# Patient Record
Sex: Female | Born: 1995 | Race: Black or African American | Hispanic: No | Marital: Single | Smoking: Former smoker
Health system: Southern US, Community
[De-identification: ages and names within clinical notes are randomized; demographics above are authoritative.]

## PROBLEM LIST (undated history)

## (undated) ENCOUNTER — Inpatient Hospital Stay (HOSPITAL_COMMUNITY): Payer: Self-pay

## (undated) DIAGNOSIS — Z9109 Other allergy status, other than to drugs and biological substances: Secondary | ICD-10-CM

## (undated) DIAGNOSIS — F419 Anxiety disorder, unspecified: Secondary | ICD-10-CM

## (undated) DIAGNOSIS — I10 Essential (primary) hypertension: Secondary | ICD-10-CM

## (undated) HISTORY — PX: NO PAST SURGERIES: SHX2092

---
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
## 2014-12-15 ENCOUNTER — Encounter (HOSPITAL_COMMUNITY): Payer: Self-pay | Admitting: Emergency Medicine

## 2014-12-15 ENCOUNTER — Emergency Department (HOSPITAL_COMMUNITY)
Admission: EM | Admit: 2014-12-15 | Discharge: 2014-12-15 | Disposition: A | Discharge status: Home / Self Care | Payer: Managed Care, Other (non HMO) | Source: Home / Self Care | Attending: Family Medicine | Admitting: Family Medicine

## 2014-12-15 DIAGNOSIS — A084 Viral intestinal infection, unspecified: Secondary | ICD-10-CM

## 2014-12-15 DIAGNOSIS — R1030 Lower abdominal pain, unspecified: Secondary | ICD-10-CM

## 2014-12-15 LAB — POCT URINALYSIS DIP (DEVICE)
BILIRUBIN URINE: NEGATIVE
GLUCOSE, UA: NEGATIVE mg/dL
HGB URINE DIPSTICK: NEGATIVE
KETONES UR: NEGATIVE mg/dL
Leukocytes, UA: NEGATIVE
Nitrite: NEGATIVE
Protein, ur: NEGATIVE mg/dL
Urobilinogen, UA: 0.2 mg/dL (ref 0.0–1.0)
pH: 6 (ref 5.0–8.0)

## 2014-12-15 MED ORDER — DICLOFENAC SODIUM 75 MG PO TBEC: 75.0000 mg | DELAYED_RELEASE_TABLET | Freq: Two times a day (BID) | ORAL | Status: DC

## 2014-12-15 NOTE — ED Provider Notes (Signed)
CSN: [id number]     Arrival date & time 12/15/14  4098 History   First MD Initiated Contact with Patient 12/15/14 [phone]     Chief Complaint  Patient presents with  . Abdominal Pain   (Consider location/radiation/quality/duration/timing/severity/associated sxs/prior Treatment) HPI  "Pain in ovaries". Started 3 mo ago. Getting worse. Comes and goes. Sharp pain. Tylenol and motrin w/o benefit. Not sexually active. On birth control. Denies frequency or dysuria.   Diarreha x 4 days. Loose. Some blood on toilet paper when wiped x1. Stooling 5-8 x daily. Worse w/ eating. Denies nausea, vomiting, CP, SOB, fever, rash.    Past Medical History  Diagnosis Date  . Environmental allergies   . Environmental allergies    History reviewed. No pertinent past surgical history. Family History  Problem Relation Age of Onset  . Cancer Neg Hx   . Diabetes Neg Hx   . Heart failure Neg Hx   . Hyperlipidemia Neg Hx   . Hypertension Neg Hx    History  Substance Use Topics  . Smoking status: Never Smoker   . Smokeless tobacco: Not on file  . Alcohol Use: No   OB History    No data available     Review of Systems Per HPI with all other pertinent systems negative.   Allergies  Review of patient's allergies indicates no known allergies.  Home Medications   Prior to Admission medications   Medication Sig Start Date End Date Taking? Authorizing Provider  Levonorgest-Eth Estrad 91-Day (SEASONIQUE PO) Take by mouth.   Yes Historical Provider, MD  diclofenac (VOLTAREN) 75 MG EC tablet Take 1 tablet (75 mg total) by mouth 2 (two) times daily. 12/15/14   Ozella Rocks, MD   BP 122/93 mmHg  Pulse 84  Temp(Src) 98 F (36.7 C) (Oral)  Resp 18  SpO2 100% Physical Exam  Constitutional: She is oriented to person, place, and time. She appears well-developed and well-nourished.  HENT:  Head: Normocephalic and atraumatic.  Eyes: EOM are normal. Pupils are equal, round, and reactive to light.  Neck:  Normal range of motion.  Cardiovascular: Normal rate, normal heart sounds and intact distal pulses.   Pulmonary/Chest: Effort normal and breath sounds normal.  Abdominal: Soft. Bowel sounds are normal. She exhibits no distension and no mass. There is no tenderness. There is no rebound and no guarding.  obese  Musculoskeletal: Normal range of motion.  Neurological: She is alert and oriented to person, place, and time.  Skin: Skin is warm.  Psychiatric: Her behavior is normal. Judgment and thought content normal.    ED Course  Procedures (including critical care time) Labs Review Labs Reviewed  POCT URINALYSIS DIP (DEVICE)    Imaging Review No results found.   MDM   1. Viral gastroenteritis   2. Lower abdominal pain    Etiology of lower abdominal pain is not immediately clear but may be due to ovarian cysts or other GYN etiology. Recommended patient follow-up with PCP. Patient are he has established patient with PCP in 10 days. Patient may need ultrasound or other studies. No acute issue requiring treatment at this time. Patient is actually active. Pregnancy test negative. UA normal. Recent pelvic exam without evidence of STDs. Patient denies any sexual activity since that time.  Diarrhea likely secondary to mild viral gastroenteritis. Patient may use Imodium sparingly. Patient to start probiotic and/or yogurt for discomfort. Patient with return precautions given.  Shelly Flatten, MD Family Medicine 12/15/2014, 9:49 AM  Ozella Rocksavid J Merrell, MD 12/15/14 [phone]

## 2014-12-15 NOTE — ED Notes (Signed)
Onset 3 months ago of pain.  Patient has been evaluated before for the same.  Patient has not been given a definite diagnosis.  Patient does not have an ob/gyn.  Low abdominal pain, intermittent.  Patient reports having this pain every day.  Reports pain as sharp.  Reports nothing changes this pain for better or worse.  Reports no appetite, "forcing self to eat" for 4 days.  Feels nauseated with eating.  No vomiting, reports diarrhea for 4 days.  Reports one diarrhea stool today.  No vaginal discharge

## 2014-12-15 NOTE — Discharge Instructions (Signed)
Your abdominal discomfort may be from ovarian cysts or from a mild gut infection. Please follow up with her primary care physician or OB/GYN survey can perform further studies. Please use Imodium sparingly for the diarrhea. This should resolve within a few days. He can use the Voltaren for any significant amounts of pain.

## 2014-12-29 ENCOUNTER — Emergency Department (HOSPITAL_COMMUNITY)
Admission: EM | Admit: 2014-12-29 | Discharge: 2014-12-30 | Disposition: A | Discharge status: Home / Self Care | Payer: Managed Care, Other (non HMO) | Attending: Emergency Medicine | Admitting: Emergency Medicine

## 2014-12-29 ENCOUNTER — Encounter (HOSPITAL_COMMUNITY): Payer: Self-pay | Admitting: Emergency Medicine

## 2014-12-29 ENCOUNTER — Other Ambulatory Visit: Payer: Self-pay | Admitting: Family Medicine

## 2014-12-29 DIAGNOSIS — R102 Pelvic and perineal pain: Secondary | ICD-10-CM

## 2014-12-29 DIAGNOSIS — Z791 Long term (current) use of non-steroidal anti-inflammatories (NSAID): Secondary | ICD-10-CM | POA: Insufficient documentation

## 2014-12-29 DIAGNOSIS — E669 Obesity, unspecified: Secondary | ICD-10-CM | POA: Diagnosis not present

## 2014-12-29 DIAGNOSIS — Z79899 Other long term (current) drug therapy: Secondary | ICD-10-CM | POA: Insufficient documentation

## 2014-12-29 NOTE — ED Notes (Signed)
Pt arrived to the ED with a complaint of pelvic pain.  Pt states the pain is midline lower abdomen/upper pelvic region.  Pt states she has seen at Urgent Care and her Doctors who suspect an ovarian cyst.  Pt has an ultrasound scheduled for Tuesday but the pain is unbearable so she was advised to come here.

## 2014-12-30 ENCOUNTER — Emergency Department (HOSPITAL_COMMUNITY): Payer: Managed Care, Other (non HMO)

## 2014-12-30 LAB — CBC WITH DIFFERENTIAL/PLATELET
BASOS ABS: 0 10*3/uL (ref 0.0–0.1)
BASOS PCT: 0 % (ref 0–1)
EOS ABS: 0 10*3/uL (ref 0.0–0.7)
EOS PCT: 1 % (ref 0–5)
HCT: 41.4 % (ref 36.0–46.0)
HEMOGLOBIN: 13.7 g/dL (ref 12.0–15.0)
Lymphocytes Relative: 43 % (ref 12–46)
Lymphs Abs: 2.3 10*3/uL (ref 0.7–4.0)
MCH: 31.3 pg (ref 26.0–34.0)
MCHC: 33.1 g/dL (ref 30.0–36.0)
MCV: 94.5 fL (ref 78.0–100.0)
MONO ABS: 0.5 10*3/uL (ref 0.1–1.0)
Monocytes Relative: 9 % (ref 3–12)
Neutro Abs: 2.6 10*3/uL (ref 1.7–7.7)
Neutrophils Relative %: 47 % (ref 43–77)
Platelets: 207 10*3/uL (ref 150–400)
RBC: 4.38 MIL/uL (ref 3.87–5.11)
RDW: 13.8 % (ref 11.5–15.5)
WBC: 5.4 10*3/uL (ref 4.0–10.5)

## 2014-12-30 LAB — URINALYSIS, ROUTINE W REFLEX MICROSCOPIC
Bilirubin Urine: NEGATIVE
Glucose, UA: NEGATIVE mg/dL
Hgb urine dipstick: NEGATIVE
KETONES UR: NEGATIVE mg/dL
LEUKOCYTES UA: NEGATIVE
NITRITE: NEGATIVE
PH: 6 (ref 5.0–8.0)
PROTEIN: NEGATIVE mg/dL
Specific Gravity, Urine: 1.027 (ref 1.005–1.030)
Urobilinogen, UA: 1 mg/dL (ref 0.0–1.0)

## 2014-12-30 LAB — BASIC METABOLIC PANEL
ANION GAP: 5 (ref 5–15)
BUN: 13 mg/dL (ref 6–23)
CALCIUM: 8.6 mg/dL (ref 8.4–10.5)
CHLORIDE: 108 mmol/L (ref 96–112)
CO2: 23 mmol/L (ref 19–32)
Creatinine, Ser: 0.8 mg/dL (ref 0.50–1.10)
GFR calc Af Amer: >90 mL/min (ref >90)
GLUCOSE: 85 mg/dL (ref 70–99)
Potassium: 4.1 mmol/L (ref 3.5–5.1)
SODIUM: 136 mmol/L (ref 135–145)

## 2014-12-30 LAB — I-STAT BETA HCG BLOOD, ED (MC, WL, AP ONLY): I-stat hCG, quantitative: <5 m[IU]/mL (ref <5)

## 2014-12-30 NOTE — ED Notes (Signed)
US bedside

## 2014-12-30 NOTE — Discharge Instructions (Signed)
Abdominal Pain, Women °Abdominal (stomach, pelvic, or belly) pain can be caused by many things. It is important to tell your doctor: °· The location of the pain. °· Does it come and go or is it present all the time? °· Are there things that start the pain (eating certain foods, exercise)? °· Are there other symptoms associated with the pain (fever, nausea, vomiting, diarrhea)? °All of this is helpful to know when trying to find the cause of the pain. °CAUSES  °· Stomach: virus or bacteria infection, or ulcer. °· Intestine: appendicitis (inflamed appendix), regional ileitis (Crohn's disease), ulcerative colitis (inflamed colon), irritable bowel syndrome, diverticulitis (inflamed diverticulum of the colon), or cancer of the stomach or intestine. °· Gallbladder disease or stones in the gallbladder. °· Kidney disease, kidney stones, or infection. °· Pancreas infection or cancer. °· Fibromyalgia (pain disorder). °· Diseases of the female organs: °¨ Uterus: fibroid (non-cancerous) tumors or infection. °¨ Fallopian tubes: infection or tubal pregnancy. °¨ Ovary: cysts or tumors. °¨ Pelvic adhesions (scar tissue). °¨ Endometriosis (uterus lining tissue growing in the pelvis and on the pelvic organs). °¨ Pelvic congestion syndrome (female organs filling up with blood just before the menstrual period). °¨ Pain with the menstrual period. °¨ Pain with ovulation (producing an egg). °¨ Pain with an IUD (intrauterine device, birth control) in the uterus. °¨ Cancer of the female organs. °· Functional pain (pain not caused by a disease, may improve without treatment). °· Psychological pain. °· Depression. °DIAGNOSIS  °Your doctor will decide the seriousness of your pain by doing an examination. °· Blood tests. °· X-rays. °· Ultrasound. °· CT scan (computed tomography, special type of X-ray). °· MRI (magnetic resonance imaging). °· Cultures, for infection. °· Barium enema (dye inserted in the large intestine, to better view it with  X-rays). °· Colonoscopy (looking in intestine with a lighted tube). °· Laparoscopy (minor surgery, looking in abdomen with a lighted tube). °· Major abdominal exploratory surgery (looking in abdomen with a large incision). °TREATMENT  °The treatment will depend on the cause of the pain.  °· Many cases can be observed and treated at home. °· Over-the-counter medicines recommended by your caregiver. °· Prescription medicine. °· Antibiotics, for infection. °· Birth control pills, for painful periods or for ovulation pain. °· Hormone treatment, for endometriosis. °· Nerve blocking injections. °· Physical therapy. °· Antidepressants. °· Counseling with a psychologist or psychiatrist. °· Minor or major surgery. °HOME CARE INSTRUCTIONS  °· Do not take laxatives, unless directed by your caregiver. °· Take over-the-counter pain medicine only if ordered by your caregiver. Do not take aspirin because it can cause an upset stomach or bleeding. °· Try a clear liquid diet (broth or water) as ordered by your caregiver. Slowly move to a bland diet, as tolerated, if the pain is related to the stomach or intestine. °· Have a thermometer and take your temperature several times a day, and record it. °· Bed rest and sleep, if it helps the pain. °· Avoid sexual intercourse, if it causes pain. °· Avoid stressful situations. °· Keep your follow-up appointments and tests, as your caregiver orders. °· If the pain does not go away with medicine or surgery, you may try: °¨ Acupuncture. °¨ Relaxation exercises (yoga, meditation). °¨ Group therapy. °¨ Counseling. °SEEK MEDICAL CARE IF:  °· You notice certain foods cause stomach pain. °· Your home care treatment is not helping your pain. °· You need stronger pain medicine. °· You want your IUD removed. °· You feel faint or   lightheaded. °· You develop nausea and vomiting. °· You develop a rash. °· You are having side effects or an allergy to your medicine. °SEEK IMMEDIATE MEDICAL CARE IF:  °· Your  pain does not go away or gets worse. °· You have a fever. °· Your pain is felt only in portions of the abdomen. The right side could possibly be appendicitis. The left lower portion of the abdomen could be colitis or diverticulitis. °· You are passing blood in your stools (bright red or black tarry stools, with or without vomiting). °· You have blood in your urine. °· You develop chills, with or without a fever. °· You pass out. °MAKE SURE YOU:  °· Understand these instructions. °· Will watch your condition. °· Will get help right away if you are not doing well or get worse. °Document Released: 07/28/2007 Document Revised: 02/14/2014 Document Reviewed: 08/17/2009 °[employer]® Patient Information ©2015 [employer]. This information is not intended to replace advice given to you by your health care provider. Make sure you discuss any questions you have with your health care provider. ° °

## 2014-12-30 NOTE — ED Provider Notes (Signed)
CSN: [id number]     Arrival date & time 12/29/14  2203 History   First MD Initiated Contact with Patient 12/30/14 0022     Chief Complaint  Patient presents with  . Pelvic Pain      HPI Patient reports she's had lower abdominal and pelvic pain over the past 3 months.  She has never seen a gynecologist for this.  She recently went and saw physician is ordered an outpatient ultrasound that was concerned about the possibility of a cyst.  No imaging has been done to this point.  She does report some diarrhea over the past several weeks but no diarrhea recently.  No dysuria or vaginal complaints.  No vaginal discharge or pain with intercourse.  She denies significant abnormal vaginal bleeding.  Symptoms are mild to moderate in severity.  Nothing worsens or improves her symptoms.  She tried over-the-counter pain medication without improvement.   Past Medical History  Diagnosis Date  . Environmental allergies   . Environmental allergies    History reviewed. No pertinent past surgical history. Family History  Problem Relation Age of Onset  . Cancer Neg Hx   . Diabetes Neg Hx   . Heart failure Neg Hx   . Hyperlipidemia Neg Hx   . Hypertension Neg Hx    History  Substance Use Topics  . Smoking status: Never Smoker   . Smokeless tobacco: Not on file  . Alcohol Use: No   OB History    No data available     Review of Systems  All other systems reviewed and are negative.     Allergies  Tomato and Other  Home Medications   Prior to Admission medications   Medication Sig Start Date End Date Taking? Authorizing Provider  acetaminophen (TYLENOL) 500 MG tablet Take 1,000 mg by mouth every 6 (six) hours as needed for moderate pain.   Yes Historical Provider, MD  diclofenac (VOLTAREN) 75 MG EC tablet Take 1 tablet (75 mg total) by mouth 2 (two) times daily. 12/15/14  Yes Ozella Rocks, MD  Levonorgest-Eth Charlott Holler 91-Day (SEASONIQUE PO) Take 1 tablet by mouth daily.    Yes Historical  Provider, MD   BP 134/76 mmHg  Pulse 90  Temp(Src) 98.2 F (36.8 C) (Oral)  Resp 20  SpO2 100% Physical Exam  Constitutional: She is oriented to person, place, and time. She appears well-developed and well-nourished. No distress.  HENT:  Head: Normocephalic and atraumatic.  Eyes: EOM are normal.  Neck: Normal range of motion.  Cardiovascular: Normal rate, regular rhythm and normal heart sounds.   Pulmonary/Chest: Effort normal and breath sounds normal.  Abdominal: Soft. She exhibits no distension.  Obese.  Mild lower abdominal/upper pelvic tenderness without guarding or rebound  Musculoskeletal: Normal range of motion.  Neurological: She is alert and oriented to person, place, and time.  Skin: Skin is warm and dry.  Psychiatric: She has a normal mood and affect. Judgment normal.  Nursing note and vitals reviewed.   ED Course  Procedures (including critical care time) Labs Review Labs Reviewed  URINALYSIS, ROUTINE W REFLEX MICROSCOPIC  CBC WITH DIFFERENTIAL/PLATELET  BASIC METABOLIC PANEL  I-STAT BETA HCG BLOOD, ED ([facility], [facility], [facility] ONLY)    Imaging Review US Transvaginal Non-ob  12/30/2014   CLINICAL DATA:  Acute onset of pelvic pain.  Initial encounter.  EXAM: TRANSABDOMINAL AND TRANSVAGINAL ULTRASOUND OF PELVIS  DOPPLER ULTRASOUND OF OVARIES  TECHNIQUE: Both transabdominal and transvaginal ultrasound examinations of the pelvis were performed. Transabdominal  technique was performed for global imaging of the pelvis including uterus, ovaries, adnexal regions, and pelvic cul-de-sac.  It was necessary to proceed with endovaginal exam following the transabdominal exam to visualize the uterus in greater detail. Color and duplex Doppler ultrasound was utilized to evaluate blood flow to the ovaries.  COMPARISON:  None.  FINDINGS: Uterus  Measurements: 7.1 x 4.1 x 4.7 cm. No fibroids or other mass visualized.  Endometrium  Thickness: 0.6 cm.  No focal abnormality visualized.  Right ovary   Measurements: 2.9 x 2.8 x 2.2 cm. Normal appearance/no adnexal mass.  Left ovary  Measurements: 3.2 x 2.5 x 1.7 cm. Normal appearance/no adnexal mass.  Pulsed Doppler evaluation of both ovaries demonstrates normal low-resistance arterial and venous waveforms.  Other findings  No free fluid is seen within the pelvic cul-de-sac.  IMPRESSION: Unremarkable pelvic ultrasound.  No evidence of ovarian torsion.   Electronically Signed   By: Roanna RaiderJeffery  Chang M.D.   On: 12/30/2014 03:06   [place] Pelvis Complete  12/30/2014   CLINICAL DATA:  Acute onset of pelvic pain.  Initial encounter.  EXAM: TRANSABDOMINAL AND TRANSVAGINAL ULTRASOUND OF PELVIS  DOPPLER ULTRASOUND OF OVARIES  TECHNIQUE: Both transabdominal and transvaginal ultrasound examinations of the pelvis were performed. Transabdominal technique was performed for global imaging of the pelvis including uterus, ovaries, adnexal regions, and pelvic cul-de-sac.  It was necessary to proceed with endovaginal exam following the transabdominal exam to visualize the uterus in greater detail. Color and duplex Doppler ultrasound was utilized to evaluate blood flow to the ovaries.  COMPARISON:  None.  FINDINGS: Uterus  Measurements: 7.1 x 4.1 x 4.7 cm. No fibroids or other mass visualized.  Endometrium  Thickness: 0.6 cm.  No focal abnormality visualized.  Right ovary  Measurements: 2.9 x 2.8 x 2.2 cm. Normal appearance/no adnexal mass.  Left ovary  Measurements: 3.2 x 2.5 x 1.7 cm. Normal appearance/no adnexal mass.  Pulsed Doppler evaluation of both ovaries demonstrates normal low-resistance arterial and venous waveforms.  Other findings  No free fluid is seen within the pelvic cul-de-sac.  IMPRESSION: Unremarkable pelvic ultrasound.  No evidence of ovarian torsion.   Electronically Signed   By: Roanna RaiderJeffery  Chang M.D.   On: 12/30/2014 03:06   [place] Art/ven Flow Abd Pelv Doppler  12/30/2014   CLINICAL DATA:  Acute onset of pelvic pain.  Initial encounter.  EXAM: TRANSABDOMINAL AND  TRANSVAGINAL ULTRASOUND OF PELVIS  DOPPLER ULTRASOUND OF OVARIES  TECHNIQUE: Both transabdominal and transvaginal ultrasound examinations of the pelvis were performed. Transabdominal technique was performed for global imaging of the pelvis including uterus, ovaries, adnexal regions, and pelvic cul-de-sac.  It was necessary to proceed with endovaginal exam following the transabdominal exam to visualize the uterus in greater detail. Color and duplex Doppler ultrasound was utilized to evaluate blood flow to the ovaries.  COMPARISON:  None.  FINDINGS: Uterus  Measurements: 7.1 x 4.1 x 4.7 cm. No fibroids or other mass visualized.  Endometrium  Thickness: 0.6 cm.  No focal abnormality visualized.  Right ovary  Measurements: 2.9 x 2.8 x 2.2 cm. Normal appearance/no adnexal mass.  Left ovary  Measurements: 3.2 x 2.5 x 1.7 cm. Normal appearance/no adnexal mass.  Pulsed Doppler evaluation of both ovaries demonstrates normal low-resistance arterial and venous waveforms.  Other findings  No free fluid is seen within the pelvic cul-de-sac.  IMPRESSION: Unremarkable pelvic ultrasound.  No evidence of ovarian torsion.   Electronically Signed   By: Roanna RaiderJeffery  Chang M.D.   On: 12/30/2014 03:06  EKG Interpretation None      MDM   Final diagnoses:  Pelvic pain in female    Labs and ultrasound without significant abnormality.  Patient is not pregnant.  I referred her to the women's outpatient clinic for gynecology follow-up.  I personally performed the services described in this documentation, which was scribed in my presence. The recorded information has been reviewed and is accurate.    Azalia Bilis, MD 12/30/14 [phone]

## 2015-01-03 ENCOUNTER — Other Ambulatory Visit: Payer: Managed Care, Other (non HMO)

## 2015-01-07 ENCOUNTER — Emergency Department (HOSPITAL_COMMUNITY)
Admission: EM | Admit: 2015-01-07 | Discharge: 2015-01-07 | Disposition: A | Discharge status: Home / Self Care | Payer: Managed Care, Other (non HMO) | Attending: Emergency Medicine | Admitting: Emergency Medicine

## 2015-01-07 ENCOUNTER — Encounter (HOSPITAL_COMMUNITY): Payer: Self-pay

## 2015-01-07 DIAGNOSIS — G8929 Other chronic pain: Secondary | ICD-10-CM | POA: Insufficient documentation

## 2015-01-07 DIAGNOSIS — Z791 Long term (current) use of non-steroidal anti-inflammatories (NSAID): Secondary | ICD-10-CM | POA: Diagnosis not present

## 2015-01-07 DIAGNOSIS — Z793 Long term (current) use of hormonal contraceptives: Secondary | ICD-10-CM | POA: Insufficient documentation

## 2015-01-07 DIAGNOSIS — R102 Pelvic and perineal pain: Secondary | ICD-10-CM | POA: Diagnosis not present

## 2015-01-07 DIAGNOSIS — Z8709 Personal history of other diseases of the respiratory system: Secondary | ICD-10-CM | POA: Insufficient documentation

## 2015-01-07 DIAGNOSIS — Z3202 Encounter for pregnancy test, result negative: Secondary | ICD-10-CM | POA: Insufficient documentation

## 2015-01-07 LAB — URINALYSIS W MICROSCOPIC (NOT AT ARMC)
BILIRUBIN URINE: NEGATIVE
Glucose, UA: NEGATIVE mg/dL
HGB URINE DIPSTICK: NEGATIVE
Ketones, ur: NEGATIVE mg/dL
LEUKOCYTES UA: NEGATIVE
Nitrite: NEGATIVE
PH: 6.5 (ref 5.0–8.0)
PROTEIN: NEGATIVE mg/dL
SPECIFIC GRAVITY, URINE: 1.023 (ref 1.005–1.030)
Urobilinogen, UA: 0.2 mg/dL (ref 0.0–1.0)

## 2015-01-07 LAB — PREGNANCY, URINE: PREG TEST UR: NEGATIVE

## 2015-01-07 MED ORDER — NAPROXEN 500 MG PO TABS: 500.0000 mg | ORAL_TABLET | Freq: Two times a day (BID) | ORAL | Status: DC

## 2015-01-07 NOTE — ED Notes (Signed)
Pt seen here on 3/17 for abdominal/pelvic pain.  Pt has had ongoing pelvic pain for months.  No dx of 3/17 after pelvic, ultrasound, labs etc.  C/o nausea wit no vomiting.  Denies discharge, difficulty urinating, change in bowel or fever

## 2015-01-07 NOTE — Discharge Instructions (Signed)
Return to the ED with any concerns including fever/chills, vomiting and not able to keep down liquids, vaginal bleeding and soaking more than one pad per hour, decreased level of alertness/lethargy, or any other alarming symptoms  You should stop the diclofenac if it is not helping and start the naproxen as prescribed.  You can also use heating pad as needed to help with pain

## 2015-01-07 NOTE — ED Provider Notes (Signed)
CSN: [id number]     Arrival date & time 01/07/15  1041 History   First MD Initiated Contact with Patient 01/07/15 1142     Chief Complaint  Patient presents with  . Pelvic Pain     (Consider location/radiation/quality/duration/timing/severity/associated sxs/prior Treatment) HPI  Pt presents with ongoing pelvic pain.  Pt states pain has been present for the past 3-4 months. Pain is constant in nature.  No fever/chills. No dysuria,.  Denies vaginal discharge or vaginal bleeding.  She states she is on seasonelle, next menses is expected in one month.  She was seen in the ED 8 days ago and had negative pelvic ultrasound.  Has had some nausea, no vomiting.  No fever.  She has appointment scheduled with GYN for 4/1 which is within the next week.  Pt states she had pelvic exam at urgent care before coming to the ED and states she was not given any medications at that time and was told everything was normal.  She has been trying diclofenac for discomfort which she states is not working.  There are no other associated systemic symptoms, there are no other alleviating or modifying factors.   Past Medical History  Diagnosis Date  . Environmental allergies   . Environmental allergies    History reviewed. No pertinent past surgical history. Family History  Problem Relation Age of Onset  . Cancer Neg Hx   . Diabetes Neg Hx   . Heart failure Neg Hx   . Hyperlipidemia Neg Hx   . Hypertension Neg Hx    History  Substance Use Topics  . Smoking status: Never Smoker   . Smokeless tobacco: Not on file  . Alcohol Use: No   OB History    No data available     Review of Systems  ROS reviewed and all otherwise negative except for mentioned in HPI    Allergies  Tomato and Other  Home Medications   Prior to Admission medications   Medication Sig Start Date End Date Taking? Authorizing Provider  acetaminophen (TYLENOL) 500 MG tablet Take 1,000 mg by mouth every 6 (six) hours as needed for  moderate pain.   Yes Historical Provider, MD  diclofenac (VOLTAREN) 75 MG EC tablet Take 1 tablet (75 mg total) by mouth 2 (two) times daily. 12/15/14  Yes Ozella Rocks, MD  fexofenadine (ALLEGRA) 180 MG tablet Take 180 mg by mouth daily as needed for allergies or rhinitis.   Yes Historical Provider, MD  Levonorgest-Eth Estrad 91-Day (SEASONIQUE PO) Take 1 tablet by mouth daily.    Yes Historical Provider, MD  naproxen (NAPROSYN) 500 MG tablet Take 1 tablet (500 mg total) by mouth 2 (two) times daily. 01/07/15   Jerelyn Scott, MD   BP 114/69 mmHg  Pulse 86  Temp(Src) 98 F (36.7 C) (Oral)  Resp 18  SpO2 100%  Vitals reviewed Physical Exam  Physical Examination: General appearance - alert, well appearing, and in no distress Mental status - alert, oriented to person, place, and time Eyes - no conjunctival injection, no scleral icterus Chest - clear to auscultation, no wheezes, rales or rhonchi, symmetric air entry Heart - normal rate, regular rhythm, normal S1, S2, no murmurs, rubs, clicks or gallops Abdomen - soft, suprapubic tenderness to palpation, no gaurding or rebound tenderness, nondistended, no masses or organomegaly Extremities - peripheral pulses normal, no pedal edema, no clubbing or cyanosis Skin - normal coloration and turgor, no rashes  ED Course  Procedures (including critical care time) Labs  Review Labs Reviewed  URINALYSIS W MICROSCOPIC  PREGNANCY, URINE    Imaging Review No results found.   EKG Interpretation None      MDM   Final diagnoses:  Pelvic pain in female    Pt presenting with chronic pelvic pain that has been ongoing and constant for the past 4 months.  She has had normal pelvic ultrasound.  She has been taking diclofenac without much relief.  I discussed doing a pelvic exam with patient and she declined to have this done- saying it was done at urgent care due to her pain and she was told it was normal.  I have d/w her and her mother the  importance of gyn followup as they are the experts in this area and will advise further about her symptoms.  Doubt appendicitis due to chronicity of pain and midline tenderness.  Doubt other emergent intraabdominal or pelvic process. PID is still on the differential but as patient declined pelvic exam I cannot diagnos this.  Pt given rx for naproxen, encouraged her to keep appointment with GYN later this week.  Discharged with strict return precautions.  Pt agreeable with plan.    Jerelyn ScottMartha Linker, MD 01/07/15 1336

## 2015-09-04 ENCOUNTER — Emergency Department (HOSPITAL_COMMUNITY)
Admission: EM | Admit: 2015-09-04 | Discharge: 2015-09-04 | Disposition: A | Discharge status: Home / Self Care | Payer: Managed Care, Other (non HMO) | Attending: Physician Assistant | Admitting: Physician Assistant

## 2015-09-04 ENCOUNTER — Encounter (HOSPITAL_COMMUNITY): Payer: Self-pay | Admitting: Emergency Medicine

## 2015-09-04 DIAGNOSIS — Z791 Long term (current) use of non-steroidal anti-inflammatories (NSAID): Secondary | ICD-10-CM | POA: Insufficient documentation

## 2015-09-04 DIAGNOSIS — Z3202 Encounter for pregnancy test, result negative: Secondary | ICD-10-CM | POA: Diagnosis not present

## 2015-09-04 DIAGNOSIS — N898 Other specified noninflammatory disorders of vagina: Secondary | ICD-10-CM | POA: Diagnosis present

## 2015-09-04 DIAGNOSIS — Z3200 Encounter for pregnancy test, result unknown: Secondary | ICD-10-CM

## 2015-09-04 DIAGNOSIS — A64 Unspecified sexually transmitted disease: Secondary | ICD-10-CM | POA: Insufficient documentation

## 2015-09-04 LAB — URINALYSIS, ROUTINE W REFLEX MICROSCOPIC
BILIRUBIN URINE: NEGATIVE
GLUCOSE, UA: NEGATIVE mg/dL
Ketones, ur: NEGATIVE mg/dL
Nitrite: NEGATIVE
Protein, ur: NEGATIVE mg/dL
SPECIFIC GRAVITY, URINE: 1.02 (ref 1.005–1.030)
pH: 6 (ref 5.0–8.0)

## 2015-09-04 LAB — POC URINE PREG, ED: PREG TEST UR: NEGATIVE

## 2015-09-04 LAB — URINE MICROSCOPIC-ADD ON

## 2015-09-04 LAB — WET PREP, GENITAL
CLUE CELLS WET PREP: NONE SEEN
SPERM: NONE SEEN
YEAST WET PREP: NONE SEEN

## 2015-09-04 MED ORDER — AZITHROMYCIN 250 MG PO TABS
1000.0000 mg | ORAL_TABLET | Freq: Once | ORAL | Status: AC
  Administered 2015-09-04: 1000 mg via ORAL
  Filled 2015-09-04: qty 4

## 2015-09-04 MED ORDER — ONDANSETRON HCL 4 MG PO TABS
4.0000 mg | ORAL_TABLET | Freq: Once | ORAL | Status: AC
  Administered 2015-09-04: 4 mg via ORAL

## 2015-09-04 MED ORDER — CEFTRIAXONE SODIUM 250 MG IJ SOLR
250.0000 mg | Freq: Once | INTRAMUSCULAR | Status: AC
  Administered 2015-09-04: 250 mg via INTRAMUSCULAR
  Filled 2015-09-04: qty 250

## 2015-09-04 MED ORDER — STERILE WATER FOR INJECTION IJ SOLN
INTRAMUSCULAR | Status: AC
  Administered 2015-09-04: 10 mL
  Filled 2015-09-04: qty 10

## 2015-09-04 MED ORDER — METRONIDAZOLE 500 MG PO TABS
2000.0000 mg | ORAL_TABLET | Freq: Once | ORAL | Status: AC
  Administered 2015-09-04: 2000 mg via ORAL
  Filled 2015-09-04: qty 4

## 2015-09-04 NOTE — ED Notes (Addendum)
Patient here for pregnancy test. LMP 07/20/2015. Also complains of vaginal discharge.

## 2015-09-04 NOTE — ED Provider Notes (Signed)
CSN: [id number]     Arrival date & time 09/04/15  1425 History  By signing my name below, I, Placido Sou, attest that this documentation has been prepared under the direction and in the presence of Cheri Fowler, PA-C. Electronically Signed: Placido Sou, ED Scribe. 09/04/2015. 2:51 PM.   Chief Complaint  Patient presents with  . Possible Pregnancy   The history is provided by the patient. No language interpreter was used.    HPI Comments: Maria Flores is a 19 y.o. female who presents to the Emergency Department requesting a pregnancy test. Pt notes being 2 weeks late for her menstrual cycle. She notes initial, mild, clear vaginal discharge on 11/9 when her menstrual cycle should have began, mild, intermittent, abd cramping, mild breast tenderness, mild, intermittent, nausea with onset 1 week ago and mild lower back pain. She notes her LNMP was on 10/9. She notes being sexually active with 1 female partner further noting that they did not use protection during intercourse around 10/25. She denies concern for STD's, but would like to be tested. She currently takes [place].  Pt denies a hx of seizures. She denies vaginal bleeding, vomiting, HA, or urinary symptoms.   PCP: Dr. Cliffton Asters  Past Medical History  Diagnosis Date  . Environmental allergies   . Environmental allergies    History reviewed. No pertinent past surgical history. Family History  Problem Relation Age of Onset  . Cancer Neg Hx   . Diabetes Neg Hx   . Heart failure Neg Hx   . Hyperlipidemia Neg Hx   . Hypertension Neg Hx    Social History  Substance Use Topics  . Smoking status: Never Smoker   . Smokeless tobacco: None  . Alcohol Use: No   OB History    No data available     Review of Systems A complete 10 system review of systems was obtained and all systems are negative except as noted in the HPI and PMH.  Allergies  Tomato and Other  Home Medications   Prior to Admission medications   Medication  Sig Start Date End Date Taking? Authorizing Provider  acetaminophen (TYLENOL) 500 MG tablet Take 1,000 mg by mouth every 6 (six) hours as needed for moderate pain.    Historical Provider, MD  diclofenac (VOLTAREN) 75 MG EC tablet Take 1 tablet (75 mg total) by mouth 2 (two) times daily. 12/15/14   Ozella Rocks, MD  fexofenadine (ALLEGRA) 180 MG tablet Take 180 mg by mouth daily as needed for allergies or rhinitis.    Historical Provider, MD  Terri Piedra 91-Day (SEASONIQUE PO) Take 1 tablet by mouth daily.     Historical Provider, MD  naproxen (NAPROSYN) 500 MG tablet Take 1 tablet (500 mg total) by mouth 2 (two) times daily. 01/07/15   Jerelyn Scott, MD   BP 163/100 mmHg  Pulse 104  Temp(Src) 98.2 F (36.8 C) (Oral)  Resp 18  SpO2 95% Physical Exam  Constitutional: She is oriented to person, place, and time. She appears well-developed and well-nourished.  HENT:  Head: Normocephalic and atraumatic.  Mouth/Throat: Oropharynx is clear and moist. No oropharyngeal exudate.  Eyes: Conjunctivae are normal.  Neck: Normal range of motion. Neck supple. No tracheal deviation present.  Cardiovascular: Normal rate, regular rhythm and normal heart sounds.   No murmur heard. Pulmonary/Chest: Effort normal and breath sounds normal. No accessory muscle usage or stridor. No respiratory distress. She has no wheezes. She has no rhonchi. She has no rales.  Abdominal: Soft. Bowel sounds are normal. She exhibits no distension. There is no tenderness. There is no rebound and no guarding.  Genitourinary: Vagina normal and uterus normal. Uterus is not enlarged and not tender. Cervix exhibits discharge (mild). Cervix exhibits no motion tenderness. Right adnexum displays no tenderness. Left adnexum displays no tenderness. No tenderness or bleeding in the vagina. No vaginal discharge found.  Musculoskeletal: Normal range of motion.  Lymphadenopathy:    She has no cervical adenopathy.  Neurological: She is  alert and oriented to person, place, and time.  Speech clear without dysarthria.  Skin: Skin is warm and dry. She is not diaphoretic.  Psychiatric: She has a normal mood and affect. Her behavior is normal.  Nursing note and vitals reviewed.  ED Course  Procedures  DIAGNOSTIC STUDIES: Oxygen Saturation is 95% on RA, adequate by my interpretation.    COORDINATION OF CARE: 2:45 PM Pt presents today due to a possible pregnancy and requests a pregnancy test. Discussed next steps with pt including a UA and pelvic exam. Pt agreed to plan.  Labs Review Labs Reviewed  WET PREP, GENITAL - Abnormal; Notable for the following:    Trich, Wet Prep PRESENT (*)    WBC, Wet Prep HPF POC MANY (*)    All other components within normal limits  URINALYSIS, ROUTINE W REFLEX MICROSCOPIC (NOT AT [facility]) - Abnormal; Notable for the following:    APPearance CLOUDY (*)    Hgb urine dipstick TRACE (*)    Leukocytes, UA LARGE (*)    All other components within normal limits  URINE MICROSCOPIC-ADD ON - Abnormal; Notable for the following:    Squamous Epithelial / LPF 0-5 (*)    Bacteria, UA RARE (*)    All other components within normal limits  HIV ANTIBODY (ROUTINE TESTING)  RPR  POC URINE PREG, ED  GC/CHLAMYDIA PROBE AMP ([place]) NOT AT [facility]    Imaging Review No results found. I have personally reviewed and evaluated these lab results as part of my medical decision-making.   EKG Interpretation None      MDM   Final diagnoses:  Sexually transmitted disease (STD)  Encounter for pregnancy test    Patient presents wanting a pregnancy test.  LNMP 10/6.  Patient is sexually active with one partner.  She states she had clear abnormal discharge around 11/9.  No other vaginal complaints.  No urinary symptoms.  Intermittent breast tenderness, nausea, and cramping.  VS show 163/100, patient has a PCP.  She will follow up regarding elevated BP reading.  On exam, heart RRR, lungs CTAB, abdomen soft and  benign.  GU exam, discharge present, no CMT tenderness or adnexal tenderness.  Will obtain HIV, RPR, GC/chlamydia, UA, and urine pregnancy.    Urine pregnancy negative. UA shows large leukocytes.  Suspect this is secondary to cervical discharge, and not UTI. Wet prep shows trich and WBCs.  Will give flagyl, azithromycin, and rocephin here.  Doubt PID.  Doubt TOA.  Evaluation does not show pathology requring ongoing emergent intervention or admission. Pt is hemodynamically stable and mentating appropriately. Discussed findings/results and plan with patient/guardian, who agrees with plan. All questions answered. Return precautions discussed and outpatient follow up given.   I personally performed the services described in this documentation, which was scribed in my presence. The recorded information has been reviewed and is accurate.     Cheri FowlerKayla Marca Gadsby, PA-C 09/04/15 1637  Courteney Randall AnLyn Mackuen, MD 09/05/15 [phone]

## 2015-09-04 NOTE — Discharge Instructions (Signed)
Sexually Transmitted Disease °A sexually transmitted disease (STD) is a disease or infection often passed to another person during sex. However, STDs can be passed through nonsexual ways. An STD can be passed through: °· Spit (saliva). °· Semen. °· Blood. °· Mucus from the vagina. °· Pee (urine). °HOW CAN I LESSEN MY CHANCES OF GETTING AN STD? °· Use: °· Latex condoms. °· Water-soluble lubricants with condoms. Do not use petroleum jelly or oils. °· Dental dams. These are small pieces of latex that are used as a barrier during oral sex. °· Avoid having more than one sex partner. °· Do not have sex with someone who has other sex partners. °· Do not have sex with anyone you do not know or who is at high risk for an STD. °· Avoid risky sex that can break your skin. °· Do not have sex if you have open sores on your mouth or skin. °· Avoid drinking too much alcohol or taking illegal drugs. Alcohol and drugs can affect your good judgment. °· Avoid oral and anal sex acts. °· Get shots (vaccines) for HPV and hepatitis. °· If you are at risk of being infected with HIV, it is advised that you take a certain medicine daily to prevent HIV infection. This is called pre-exposure prophylaxis (PrEP). You may be at risk if: °· You are a man who has sex with other men (MSM). °· You are attracted to the opposite sex (heterosexual) and are having sex with more than one partner. °· You take drugs with a needle. °· You have sex with someone who has HIV. °· Talk with your doctor about if you are at high risk of being infected with HIV. If you begin to take PrEP, get tested for HIV first. Get tested every 3 months for as long as you are taking PrEP. °· Get tested for STDs every year if you are sexually active. If you are treated for an STD, get tested again 3 months after you are treated. °WHAT SHOULD I DO IF I THINK I HAVE AN STD? °· See your doctor. °· Tell your sex partner(s) that you have an STD. They should be tested and treated. °· Do  not have sex until your doctor says it is okay. °WHEN SHOULD I GET HELP? °Get help right away if: °· You have bad belly (abdominal) pain. °· You are a man and have puffiness (swelling) or pain in your testicles. °· You are a woman and have puffiness in your vagina. °  °This information is not intended to replace advice given to you by your health care provider. Make sure you discuss any questions you have with your health care provider. °  °Document Released: 11/07/2004 Document Revised: 10/21/2014 Document Reviewed: 03/26/2013 °Elsevier Interactive Patient Education ©2016 Elsevier Inc. ° °Safe Sex °Safe sex is about reducing the risk of giving or getting a sexually transmitted disease (STD). STDs are spread through sexual contact involving the genitals, mouth, or rectum. Some STDs can be cured and others cannot. Safe sex can also prevent unintended pregnancies.  °WHAT ARE SOME SAFE SEX PRACTICES? °· Limit your sexual activity to only one partner who is having sex with only you. °· Talk to your partner about his or her past partners, past STDs, and drug use. °· Use a condom every time you have sexual intercourse. This includes vaginal, oral, and anal sexual activity. Both females and males should wear condoms during oral sex. Only use latex or polyurethane condoms and water-based lubricants.   lubricants. Using petroleum-based lubricants or oils to lubricate a condom will weaken the condom and increase the chance that it will break. The condom should be in place from the beginning to the end of sexual activity. Wearing a condom reduces, but does not completely eliminate, your risk of getting or giving an STD. STDs can be spread by contact with infected body fluids and skin.  Get vaccinated for hepatitis B and HPV.  Avoid alcohol and recreational drugs, which can affect your judgment. You may forget to use a condom or participate in high-risk sex.  For females, avoid douching after sexual intercourse. Douching can spread an  infection farther into the reproductive tract.  Check your body for signs of sores, blisters, rashes, or unusual discharge. See your health care provider if you notice any of these signs.  Avoid sexual contact if you have symptoms of an infection or are being treated for an STD. If you or your partner has herpes, avoid sexual contact when blisters are present. Use condoms at all other times.  If you are at risk of being infected with HIV, it is recommended that you take a prescription medicine daily to prevent HIV infection. This is called pre-exposure prophylaxis (PrEP). You are considered at risk if:  You are a man who has sex with other men (MSM).  You are a heterosexual man or woman who is sexually active with more than one partner.  You take drugs by injection.  You are sexually active with a partner who has HIV. Talk with your health care provider about whether you are at high risk of being infected with HIV. If you choose to begin PrEP, you should first be tested for HIV. You should then be tested every 3 months for as long as you are taking PrEP.  See your health care provider for regular screenings, exams, and tests for other STDs. Before having sex with a new partner, each of you should be screened for STDs and should talk about the results with each other. WHAT ARE THE BENEFITS OF SAFE SEX?  There is less chance of getting or giving an STD.  You can prevent unwanted or unintended pregnancies.  By discussing safe sex concerns with your partner, you may increase feelings of intimacy, comfort, trust, and honesty between the two of you. This information is not intended to replace advice given to you by your health care provider. Make sure you discuss any questions you have with your health care provider.  Document Released: 11/07/2004 Document Revised: 10/21/2014 Document Reviewed: 03/23/2012  Elsevier Interactive Patient Education [employer]2016 Elsevier Inc.

## 2015-09-05 LAB — RPR: RPR Ser Ql: NONREACTIVE

## 2015-09-05 LAB — HIV ANTIBODY (ROUTINE TESTING W REFLEX): HIV Screen 4th Generation wRfx: NONREACTIVE

## 2015-09-06 LAB — GC/CHLAMYDIA PROBE AMP (~~LOC~~) NOT AT ARMC
Chlamydia: NEGATIVE
Neisseria Gonorrhea: NEGATIVE

## 2016-03-24 ENCOUNTER — Emergency Department (HOSPITAL_COMMUNITY)
Admission: EM | Admit: 2016-03-24 | Discharge: 2016-03-24 | Disposition: A | Discharge status: Home / Self Care | Payer: Managed Care, Other (non HMO) | Attending: Emergency Medicine | Admitting: Emergency Medicine

## 2016-03-24 ENCOUNTER — Encounter (HOSPITAL_COMMUNITY): Payer: Self-pay | Admitting: Emergency Medicine

## 2016-03-24 DIAGNOSIS — J020 Streptococcal pharyngitis: Secondary | ICD-10-CM | POA: Diagnosis not present

## 2016-03-24 DIAGNOSIS — J029 Acute pharyngitis, unspecified: Secondary | ICD-10-CM | POA: Diagnosis present

## 2016-03-24 DIAGNOSIS — H66013 Acute suppurative otitis media with spontaneous rupture of ear drum, bilateral: Secondary | ICD-10-CM

## 2016-03-24 DIAGNOSIS — Z79899 Other long term (current) drug therapy: Secondary | ICD-10-CM | POA: Insufficient documentation

## 2016-03-24 LAB — CBC
HEMATOCRIT: 44.2 % (ref 36.0–46.0)
Hemoglobin: 14.9 g/dL (ref 12.0–15.0)
MCH: 31.1 pg (ref 26.0–34.0)
MCHC: 33.7 g/dL (ref 30.0–36.0)
MCV: 92.3 fL (ref 78.0–100.0)
PLATELETS: 153 10*3/uL (ref 150–400)
RBC: 4.79 MIL/uL (ref 3.87–5.11)
RDW: 14 % (ref 11.5–15.5)
WBC: 6.7 10*3/uL (ref 4.0–10.5)

## 2016-03-24 LAB — COMPREHENSIVE METABOLIC PANEL
ALT: 16 U/L (ref 14–54)
AST: 28 U/L (ref 15–41)
Albumin: 4.3 g/dL (ref 3.5–5.0)
Alkaline Phosphatase: 89 U/L (ref 38–126)
Anion gap: 10 (ref 5–15)
BUN: 7 mg/dL (ref 6–20)
CHLORIDE: 102 mmol/L (ref 101–111)
CO2: 22 mmol/L (ref 22–32)
CREATININE: 0.88 mg/dL (ref 0.44–1.00)
Calcium: 8.7 mg/dL — ABNORMAL LOW (ref 8.9–10.3)
Glucose, Bld: 87 mg/dL (ref 65–99)
POTASSIUM: 3.3 mmol/L — AB (ref 3.5–5.1)
Sodium: 134 mmol/L — ABNORMAL LOW (ref 135–145)
Total Bilirubin: 0.9 mg/dL (ref 0.3–1.2)
Total Protein: 8.3 g/dL — ABNORMAL HIGH (ref 6.5–8.1)

## 2016-03-24 LAB — LIPASE, BLOOD: LIPASE: 27 U/L (ref 11–51)

## 2016-03-24 LAB — RAPID STREP SCREEN (MED CTR MEBANE ONLY): STREPTOCOCCUS, GROUP A SCREEN (DIRECT): POSITIVE — AB

## 2016-03-24 LAB — I-STAT BETA HCG BLOOD, ED (MC, WL, AP ONLY): I-stat hCG, quantitative: <5 m[IU]/mL (ref <5)

## 2016-03-24 MED ORDER — PREDNISONE 50 MG PO TABS: ORAL_TABLET | ORAL | Status: DC

## 2016-03-24 MED ORDER — ACETAMINOPHEN 325 MG PO TABS
975.0000 mg | ORAL_TABLET | Freq: Once | ORAL | Status: AC
  Administered 2016-03-24: 975 mg via ORAL
  Filled 2016-03-24: qty 3

## 2016-03-24 MED ORDER — ONDANSETRON 4 MG PO TBDP
4.0000 mg | ORAL_TABLET | Freq: Once | ORAL | Status: AC
  Administered 2016-03-24: 4 mg via ORAL
  Filled 2016-03-24: qty 1

## 2016-03-24 MED ORDER — PENICILLIN G BENZATHINE 1200000 UNIT/2ML IM SUSP
1.2000 10*6.[IU] | Freq: Once | INTRAMUSCULAR | Status: AC
  Administered 2016-03-24: 1.2 10*6.[IU] via INTRAMUSCULAR
  Filled 2016-03-24: qty 2

## 2016-03-24 MED ORDER — PREDNISONE 20 MG PO TABS
60.0000 mg | ORAL_TABLET | Freq: Once | ORAL | Status: AC
  Administered 2016-03-24: 60 mg via ORAL
  Filled 2016-03-24: qty 3

## 2016-03-24 MED ORDER — ONDANSETRON 4 MG PO TBDP: 4.0000 mg | ORAL_TABLET | Freq: Three times a day (TID) | ORAL | Status: DC | PRN

## 2016-03-24 NOTE — ED Provider Notes (Signed)
CSN: [id number]     Arrival date & time 03/24/16  1318 History   First MD Initiated Contact with Patient 03/24/16 1525     CC: Sore throat and emesis  (Consider location/radiation/quality/duration/timing/severity/associated sxs/prior Treatment) HPI   Blood pressure 133/82, pulse 110, temperature 99 F (37.2 C), temperature source Oral, resp. rate 16, SpO2 100 %.  Maria Flores is a 20 y.o. female complaining of sore throat, bilateral ear pain, dry cough with bleeding gums emesis worsening over the course of 3 days. Patient denies fever, chills, abdominal pain, chest pain, shortness of breath, change in urination or defecation, sick contacts, rash, cervicalgia.  Past Medical History  Diagnosis Date  . Environmental allergies   . Environmental allergies    History reviewed. No pertinent past surgical history. Family History  Problem Relation Age of Onset  . Cancer Neg Hx   . Diabetes Neg Hx   . Heart failure Neg Hx   . Hyperlipidemia Neg Hx   . Hypertension Neg Hx    Social History  Substance Use Topics  . Smoking status: Never Smoker   . Smokeless tobacco: None  . Alcohol Use: No   OB History    No data available     Review of Systems  10 systems reviewed and found to be negative, except as noted in the HPI.   Allergies  Tomato and Other  Home Medications   Prior to Admission medications   Medication Sig Start Date End Date Taking? Authorizing Provider  acetaminophen (TYLENOL) 500 MG tablet Take 1,000 mg by mouth every 6 (six) hours as needed for moderate pain.    Historical Provider, MD  diclofenac (VOLTAREN) 75 MG EC tablet Take 1 tablet (75 mg total) by mouth 2 (two) times daily. 12/15/14   Ozella Rocks, MD  fexofenadine (ALLEGRA) 180 MG tablet Take 180 mg by mouth daily as needed for allergies or rhinitis.    Historical Provider, MD  Terri Piedra 91-Day (SEASONIQUE PO) Take 1 tablet by mouth daily.     Historical Provider, MD  naproxen (NAPROSYN)  500 MG tablet Take 1 tablet (500 mg total) by mouth 2 (two) times daily. 01/07/15   Jerelyn Scott, MD  ondansetron (ZOFRAN ODT) 4 MG disintegrating tablet Take 1 tablet (4 mg total) by mouth every 8 (eight) hours as needed for nausea or vomiting. 03/24/16   Joni Reining Taviana Westergren, PA-C  predniSONE (DELTASONE) 50 MG tablet Take 1 tablet daily with breakfast 03/24/16   Darcel Frane, PA-C   BP 130/78 mmHg  Pulse 101  Temp(Src) 99 F (37.2 C) (Oral)  Resp 16  SpO2 100% Physical Exam  Constitutional: She is oriented to person, place, and time. She appears well-developed and well-nourished. No distress.  HENT:  Head: Normocephalic and atraumatic.  Mouth/Throat: Oropharynx is clear and moist.  Tonsillar hypertrophy 3+ with exudate, uvula is midline, soft palate rises symmetrically, no intraoral plaques  Bilateral tympanic membranes are erythematous and bulging  Eyes: Conjunctivae and EOM are normal. Pupils are equal, round, and reactive to light.  Neck: Normal range of motion. Neck supple.  Cardiovascular: Normal rate, regular rhythm and intact distal pulses.   Pulmonary/Chest: Effort normal and breath sounds normal. No respiratory distress. She has no wheezes. She has no rales. She exhibits no tenderness.  Abdominal: Soft. Bowel sounds are normal. She exhibits no distension and no mass. There is no tenderness. There is no rebound and no guarding.  Musculoskeletal: Normal range of motion.  Neurological: She is alert and  oriented to person, place, and time.  Skin: She is not diaphoretic.  Psychiatric: She has a normal mood and affect.  Nursing note and vitals reviewed.   ED Course  Procedures (including critical care time) Labs Review Labs Reviewed  RAPID STREP SCREEN (NOT AT [facility]) - Abnormal; Notable for the following:    Streptococcus, Group A Screen (Direct) POSITIVE (*)    All other components within normal limits  COMPREHENSIVE METABOLIC PANEL - Abnormal; Notable for the following:     Sodium 134 (*)    Potassium 3.3 (*)    Calcium 8.7 (*)    Total Protein 8.3 (*)    All other components within normal limits  LIPASE, BLOOD  CBC  I-STAT BETA HCG BLOOD, ED ([facility], [facility], [facility] ONLY)    Imaging Review No results found. I have personally reviewed and evaluated these images and lab results as part of my medical decision-making.   EKG Interpretation None      MDM   Final diagnoses:  Strep pharyngitis  Suppurative otitis media of both ears with spontaneous tympanic membrane rupture, recurrence not specified, unspecified chronicity    Filed Vitals:   03/24/16 1323 03/24/16 1536  BP: 133/82 130/78  Pulse: 110 101  Temp: 99 F (37.2 C)   TempSrc: Oral   Resp: 16 16  SpO2: 100% 100%    Medications  penicillin g benzathine (BICILLIN LA) 1200000 UNIT/2ML injection 1.2 Million Units (1.2 Million Units Intramuscular Given 03/24/16 1550)  ondansetron (ZOFRAN-ODT) disintegrating tablet 4 mg (4 mg Oral Given 03/24/16 1549)  predniSONE (DELTASONE) tablet 60 mg (60 mg Oral Given 03/24/16 1550)  acetaminophen (TYLENOL) tablet 975 mg (975 mg Oral Given 03/24/16 1550)    Maria Flores is 20 y.o. female presenting with Sore throat and pain and emesis. Abdominal exam is benign. Patient is overall very well appearing, mild tachycardia. Rapid strep is positive, physical exam is consistent with bilateral otitis media, patient is passed by mouth challenge, she opts for Bicillin, will give prednisone and ODT  Evaluation does not show pathology that would require ongoing emergent intervention or inpatient treatment. Pt is hemodynamically stable and mentating appropriately. Discussed findings and plan with patient/guardian, who agrees with care plan. All questions answered. Return precautions discussed and outpatient follow up given.   New Prescriptions   ONDANSETRON (ZOFRAN ODT) 4 MG DISINTEGRATING TABLET    Take 1 tablet (4 mg total) by mouth every 8 (eight) hours as needed for nausea  or vomiting.   PREDNISONE (DELTASONE) 50 MG TABLET    Take 1 tablet daily with breakfast         Maria Emeryicole Latecia Miler, PA-C 03/24/16 1558  Samuel JesterKathleen McManus, DO 03/27/16 2257

## 2016-03-24 NOTE — Discharge Instructions (Signed)
Please follow with your primary care doctor in the next 2 days for a check-up. They must obtain records for further management.   Do not hesitate to return to the Emergency Department for any new, worsening or concerning symptoms.    Strep Throat Strep throat is a bacterial infection of the throat. Your health care provider may call the infection tonsillitis or pharyngitis, depending on whether there is swelling in the tonsils or at the back of the throat. Strep throat is most common during the cold months of the year in children who are 925-21 years of age, but it can happen during any season in people of any age. This infection is spread from person to person (contagious) through coughing, sneezing, or close contact. CAUSES Strep throat is caused by the bacteria called Streptococcus pyogenes. RISK FACTORS This condition is more likely to develop in:  People who spend time in crowded places where the infection can spread easily.  People who have close contact with someone who has strep throat. SYMPTOMS Symptoms of this condition include:  Fever or chills.   Redness, swelling, or pain in the tonsils or throat.  Pain or difficulty when swallowing.  White or yellow spots on the tonsils or throat.  Swollen, tender glands in the neck or under the jaw.  Red rash all over the body (rare). DIAGNOSIS This condition is diagnosed by performing a rapid strep test or by taking a swab of your throat (throat culture test). Results from a rapid strep test are usually ready in a few minutes, but throat culture test results are available after one or two days. TREATMENT This condition is treated with antibiotic medicine. HOME CARE INSTRUCTIONS Medicines  Take over-the-counter and prescription medicines only as told by your health care provider.  Take your antibiotic as told by your health care provider. Do not stop taking the antibiotic even if you start to feel better.  Have family members who  also have a sore throat or fever tested for strep throat. They may need antibiotics if they have the strep infection. Eating and Drinking  Do not share food, drinking cups, or personal items that could cause the infection to spread to other people.  If swallowing is difficult, try eating soft foods until your sore throat feels better.  Drink enough fluid to keep your urine clear or pale yellow. General Instructions  Gargle with a salt-water mixture 3-4 times per day or as needed. To make a salt-water mixture, completely dissolve -1 tsp of salt in 1 cup of warm water.  Make sure that all household members wash their hands well.  Get plenty of rest.  Stay home from school or work until you have been taking antibiotics for 24 hours.  Keep all follow-up visits as told by your health care provider. This is important. SEEK MEDICAL CARE IF:  The glands in your neck continue to get bigger.  You develop a rash, cough, or earache.  You cough up a thick liquid that is green, yellow-brown, or bloody.  You have pain or discomfort that does not get better with medicine.  Your problems seem to be getting worse rather than better.  You have a fever. SEEK IMMEDIATE MEDICAL CARE IF:  You have new symptoms, such as vomiting, severe headache, stiff or painful neck, chest pain, or shortness of breath.  You have severe throat pain, drooling, or changes in your voice.  You have swelling of the neck, or the skin on the neck becomes red  and tender.  You have signs of dehydration, such as fatigue, dry mouth, and decreased urination.  You become increasingly sleepy, or you cannot wake up completely.  Your joints become red or painful.   This information is not intended to replace advice given to you by your health care provider. Make sure you discuss any questions you have with your health care provider.   Document Released: 09/27/2000 Document Revised: 06/21/2015 Document Reviewed:  01/23/2015 Elsevier Interactive Patient Education [employer]

## 2016-03-24 NOTE — ED Notes (Signed)
Pt c/o emesis onset one week ago, worse in morning, swollen and bleeding gums, ear pain and pressure, white coating to tongue, clear mucus, cough, sore throat onset 3 days ago. Tonsils edematous, erythematous, yellow/white purulent discharge. MP 12-17-15, pt states this is her normal.

## 2016-03-24 NOTE — ED Notes (Signed)
Bed: WA01 Expected date:  Expected time:  Means of arrival:  Comments: [place]

## 2016-04-11 ENCOUNTER — Emergency Department (HOSPITAL_COMMUNITY): Payer: Managed Care, Other (non HMO)

## 2016-04-11 ENCOUNTER — Encounter (HOSPITAL_COMMUNITY): Payer: Self-pay | Admitting: Emergency Medicine

## 2016-04-11 ENCOUNTER — Emergency Department (HOSPITAL_COMMUNITY)
Admission: EM | Admit: 2016-04-11 | Discharge: 2016-04-11 | Disposition: A | Discharge status: Home / Self Care | Payer: Managed Care, Other (non HMO) | Attending: Emergency Medicine | Admitting: Emergency Medicine

## 2016-04-11 DIAGNOSIS — Z79899 Other long term (current) drug therapy: Secondary | ICD-10-CM | POA: Diagnosis not present

## 2016-04-11 DIAGNOSIS — J029 Acute pharyngitis, unspecified: Secondary | ICD-10-CM | POA: Diagnosis present

## 2016-04-11 DIAGNOSIS — J309 Allergic rhinitis, unspecified: Secondary | ICD-10-CM | POA: Insufficient documentation

## 2016-04-11 LAB — RAPID STREP SCREEN (MED CTR MEBANE ONLY): Streptococcus, Group A Screen (Direct): NEGATIVE

## 2016-04-11 MED ORDER — CETIRIZINE HCL 1 MG/ML PO SYRP: 10.0000 mg | ORAL_SOLUTION | Freq: Every day | ORAL | Status: DC

## 2016-04-11 NOTE — ED Notes (Signed)
Pt reports she was diagnosed with strep throat 2 weeks ago. Felt better initially after treatment, but symptoms have returned. Now also has cough.

## 2016-04-11 NOTE — ED Provider Notes (Signed)
CSN: [id number]     Arrival date & time 04/11/16  [id number] History   First MD Initiated Contact with Patient 04/11/16 1020     Chief Complaint  Patient presents with  . Sore Throat  . Cough      HPI Patient presents with cough clear sputum over the last day or 2.  Was treated for strep throat 2 weeks ago.  She also has slight sore throat but not near as bad as 2 weeks ago.  No documented fever.  Patient's also had a runny nose and other symptoms of allergies. Past Medical History  Diagnosis Date  . Environmental allergies   . Environmental allergies    History reviewed. No pertinent past surgical history. Family History  Problem Relation Age of Onset  . Cancer Neg Hx   . Diabetes Neg Hx   . Heart failure Neg Hx   . Hyperlipidemia Neg Hx   . Hypertension Neg Hx    Social History  Substance Use Topics  . Smoking status: Never Smoker   . Smokeless tobacco: None  . Alcohol Use: No   OB History    No data available     Review of Systems   All other systems reviewed and are negative Allergies  Tomato and Other  Home Medications   Prior to Admission medications   Medication Sig Start Date End Date Taking? Authorizing Provider  metroNIDAZOLE (METROGEL) 0.75 % vaginal gel Place 1 application vaginally daily as needed. irritation 03/26/16  Yes Historical Provider, MD  Phenylephrine-Pheniramine-DM [facility](THERAFLU COLD & COUGH PO) Take 1 each by mouth every 8 (eight) hours as needed (COLD SYMPTOMS).   Yes Historical Provider, MD  cetirizine (ZYRTEC) 1 MG/ML syrup Take 10 mLs (10 mg total) by mouth daily. 04/11/16   Nelva Nayobert Rand Boller, MD  clindamycin (CLEOCIN) 300 MG capsule Take 300 mg by mouth 3 (three) times daily. Reported on 04/11/2016 03/26/16   Historical Provider, MD  ondansetron (ZOFRAN ODT) 4 MG disintegrating tablet Take 1 tablet (4 mg total) by mouth every 8 (eight) hours as needed for nausea or vomiting. Patient not taking: Reported on 04/11/2016 03/24/16   Joni ReiningNicole Pisciotta, PA-C   predniSONE (DELTASONE) 50 MG tablet Take 1 tablet daily with breakfast Patient not taking: Reported on 04/11/2016 03/24/16   Joni ReiningNicole Pisciotta, PA-C  valACYclovir (VALTREX) 1000 MG tablet Take 1 g by mouth daily. Reported on 04/11/2016 04/01/16   Historical Provider, MD  valACYclovir (VALTREX) 500 MG tablet Take 500 mg by mouth 2 (two) times daily.    Historical Provider, MD   BP 143/90 mmHg  Pulse 104  Temp(Src) 97.9 F (36.6 C) (Oral)  Resp 16  SpO2 100% Physical Exam Physical Exam  Nursing note and vitals reviewed. Constitutional: She is oriented to person, place, and time. She appears well-developed and well-nourished. No distress.  HENT: Throat: Uvula normal.  No tonsillar edema or exudate noted.  Slight erythema.  No airway difficulty.  Head: Normocephalic and atraumatic.  Eyes: Pupils are equal, round, and reactive to light.  Neck: Normal range of motion.  Cardiovascular: Normal rate and intact distal pulses.   Pulmonary/Chest: No respiratory distress.  Abdominal: Normal appearance. She exhibits no distension.  Musculoskeletal: Normal range of motion.  Neurological: She is alert and oriented to person, place, and time. No cranial nerve deficit.  Skin: Skin is warm and dry. No rash noted.  Psychiatric: She has a normal mood and affect. Her behavior is normal.   ED Course  Procedures (including critical care  time) Labs Review Labs Reviewed  RAPID STREP SCREEN (NOT AT [facility])  CULTURE, GROUP A STREP [facility])    Imaging Review Dg Chest 2 View  04/11/2016  CLINICAL DATA:  Recent diagnosis of strep throat with persistent cough, initial encounter EXAM: CHEST  2 VIEW COMPARISON:  None. FINDINGS: The heart size and mediastinal contours are within normal limits. Both lungs are clear. The visualized skeletal structures are unremarkable. IMPRESSION: No active cardiopulmonary disease. Electronically Signed   By: Alcide CleverMark  Lukens M.D.   On: 04/11/2016 10:44   I have personally reviewed and  evaluated these images and lab results as part of my medical decision-making.    MDM   Final diagnoses:  Allergic rhinitis, unspecified allergic rhinitis type        Nelva Nayobert Sean Macwilliams, MD 04/11/16 1056

## 2016-04-11 NOTE — Discharge Instructions (Signed)

## 2016-04-13 LAB — CULTURE, GROUP A STREP (THRC)

## 2016-11-09 ENCOUNTER — Ambulatory Visit (HOSPITAL_COMMUNITY)
Admission: EM | Admit: 2016-11-09 | Discharge: 2016-11-09 | Disposition: A | Discharge status: Home / Self Care | Payer: Managed Care, Other (non HMO)

## 2016-11-09 ENCOUNTER — Encounter (HOSPITAL_COMMUNITY): Payer: Self-pay | Admitting: Emergency Medicine

## 2016-11-09 DIAGNOSIS — J069 Acute upper respiratory infection, unspecified: Secondary | ICD-10-CM

## 2016-11-09 DIAGNOSIS — B9789 Other viral agents as the cause of diseases classified elsewhere: Secondary | ICD-10-CM

## 2016-11-09 MED ORDER — IPRATROPIUM BROMIDE 0.06 % NA SOLN: 2.0000 | Freq: Four times a day (QID) | NASAL | 0 refills | Status: DC

## 2016-11-09 MED ORDER — BENZONATATE 100 MG PO CAPS: 100.0000 mg | ORAL_CAPSULE | Freq: Three times a day (TID) | ORAL | 0 refills | Status: DC

## 2016-11-09 NOTE — ED Provider Notes (Signed)
CSN: [id number]     Arrival date & time 11/09/16  1211 History   First MD Initiated Contact with Patient 11/09/16 1353     Chief Complaint  Patient presents with  . URI   (Consider location/radiation/quality/duration/timing/severity/associated sxs/prior Treatment) Patient c/o nasal congestion, cough, uri sx's for a week.   The history is provided by the patient.  URI  Presenting symptoms: congestion   Severity:  Moderate Onset quality:  Sudden Duration:  1 week Timing:  Constant Progression:  Worsening Relieved by:  Nothing Worsened by:  Nothing   Past Medical History:  Diagnosis Date  . Environmental allergies   . Environmental allergies    History reviewed. No pertinent surgical history. Family History  Problem Relation Age of Onset  . Cancer Neg Hx   . Diabetes Neg Hx   . Heart failure Neg Hx   . Hyperlipidemia Neg Hx   . Hypertension Neg Hx    Social History  Substance Use Topics  . Smoking status: Never Smoker  . Smokeless tobacco: Not on file  . Alcohol use No   OB History    No data available     Review of Systems  Constitutional: Negative.   HENT: Positive for congestion.   Eyes: Negative.   Respiratory: Negative.   Cardiovascular: Negative.   Gastrointestinal: Negative.   Endocrine: Negative.   Genitourinary: Negative.   Musculoskeletal: Negative.   Allergic/Immunologic: Negative.   Neurological: Negative.   Hematological: Negative.   Psychiatric/Behavioral: Negative.     Allergies  Tomato and Other  Home Medications   Prior to Admission medications   Medication Sig Start Date End Date Taking? Authorizing Provider  Dextromethorphan Polistirex (ROBITUSSIN 12 HOUR COUGH PO) Take by mouth.   Yes Historical Provider, MD  OVER THE COUNTER MEDICATION otc [facility] brand cold medicine   Yes Historical Provider, MD  benzonatate (TESSALON) 100 MG capsule Take 1 capsule (100 mg total) by mouth every 8 (eight) hours. 11/09/16   Deatra Canter, FNP   cetirizine (ZYRTEC) 1 MG/ML syrup Take 10 mLs (10 mg total) by mouth daily. Patient not taking: Reported on 11/09/2016 04/11/16   Nelva Nay, MD  clindamycin (CLEOCIN) 300 MG capsule Take 300 mg by mouth 3 (three) times daily. Reported on 04/11/2016 03/26/16   Historical Provider, MD  ipratropium (ATROVENT) 0.06 % nasal spray Place 2 sprays into both nostrils 4 (four) times daily. 11/09/16   Deatra Canter, FNP  metroNIDAZOLE (METROGEL) 0.75 % vaginal gel Place 1 application vaginally daily as needed. irritation 03/26/16   Historical Provider, MD  ondansetron (ZOFRAN ODT) 4 MG disintegrating tablet Take 1 tablet (4 mg total) by mouth every 8 (eight) hours as needed for nausea or vomiting. Patient not taking: Reported on 04/11/2016 03/24/16   Joni Reining Pisciotta, PA-C  Phenylephrine-Pheniramine-DM [facility] COLD & COUGH PO) Take 1 each by mouth every 8 (eight) hours as needed (COLD SYMPTOMS).    Historical Provider, MD  predniSONE (DELTASONE) 50 MG tablet Take 1 tablet daily with breakfast Patient not taking: Reported on 04/11/2016 03/24/16   Joni Reining Pisciotta, PA-C  valACYclovir (VALTREX) 1000 MG tablet Take 1 g by mouth daily. Reported on 04/11/2016 04/01/16   Historical Provider, MD  valACYclovir (VALTREX) 500 MG tablet Take 500 mg by mouth 2 (two) times daily.    Historical Provider, MD   Meds Ordered and Administered this Visit  Medications - No data to display  BP 132/93 (BP Location: Right Arm) Comment (BP Location): large cuff  Pulse  78   Temp 98.2 F (36.8 C) (Oral)   Resp 20   LMP 11/03/2016   SpO2 100%  No data found.   Physical Exam  Constitutional: She appears well-developed and well-nourished.  HENT:  Head: Normocephalic and atraumatic.  Right Ear: External ear normal.  Left Ear: External ear normal.  Mouth/Throat: Oropharynx is clear and moist.  Eyes: Conjunctivae and EOM are normal. Pupils are equal, round, and reactive to light.  Neck: Normal range of motion. Neck supple.   Cardiovascular: Normal rate, regular rhythm and normal heart sounds.   Pulmonary/Chest: Effort normal and breath sounds normal.  Abdominal: Soft. Bowel sounds are normal.  Nursing note and vitals reviewed.   Urgent Care Course     Procedures (including critical care time)  Labs Review Labs Reviewed - No data to display  Imaging Review No results found.   Visual Acuity Review  Right Eye Distance:   Left Eye Distance:   Bilateral Distance:    Right Eye Near:   Left Eye Near:    Bilateral Near:         MDM   1. Viral URI with cough    Atrovent nasal spray Tessalon Perles Push po fluids, rest, tylenol and motrin otc prn as directed for fever, arthralgias, and myalgias.  Follow up prn if sx's continue or persist.    Deatra CanterWilliam J Nakita Santerre, FNP 11/09/16 [phone]

## 2016-11-09 NOTE — ED Triage Notes (Signed)
Symptoms for one week, cough, nasal congestion, cough, chest hurts with coughing, denies fever

## 2016-12-13 ENCOUNTER — Encounter (HOSPITAL_COMMUNITY): Payer: Self-pay | Admitting: Emergency Medicine

## 2016-12-13 ENCOUNTER — Emergency Department (HOSPITAL_COMMUNITY)
Admission: EM | Admit: 2016-12-13 | Discharge: 2016-12-13 | Disposition: A | Discharge status: Home / Self Care | Payer: 59 | Attending: Emergency Medicine | Admitting: Emergency Medicine

## 2016-12-13 ENCOUNTER — Emergency Department (HOSPITAL_COMMUNITY): Payer: 59

## 2016-12-13 DIAGNOSIS — R0789 Other chest pain: Secondary | ICD-10-CM | POA: Insufficient documentation

## 2016-12-13 DIAGNOSIS — R42 Dizziness and giddiness: Secondary | ICD-10-CM | POA: Diagnosis present

## 2016-12-13 DIAGNOSIS — R079 Chest pain, unspecified: Secondary | ICD-10-CM

## 2016-12-13 LAB — URINALYSIS, ROUTINE W REFLEX MICROSCOPIC
BILIRUBIN URINE: NEGATIVE
Glucose, UA: NEGATIVE mg/dL
HGB URINE DIPSTICK: NEGATIVE
KETONES UR: NEGATIVE mg/dL
Leukocytes, UA: NEGATIVE
NITRITE: NEGATIVE
PROTEIN: NEGATIVE mg/dL
Specific Gravity, Urine: 1.014 (ref 1.005–1.030)
pH: 6 (ref 5.0–8.0)

## 2016-12-13 LAB — CBC
HCT: 38.8 % (ref 36.0–46.0)
HEMOGLOBIN: 13 g/dL (ref 12.0–15.0)
MCH: 31.1 pg (ref 26.0–34.0)
MCHC: 33.5 g/dL (ref 30.0–36.0)
MCV: 92.8 fL (ref 78.0–100.0)
PLATELETS: 199 10*3/uL (ref 150–400)
RBC: 4.18 MIL/uL (ref 3.87–5.11)
RDW: 14.4 % (ref 11.5–15.5)
WBC: 6.3 10*3/uL (ref 4.0–10.5)

## 2016-12-13 LAB — BASIC METABOLIC PANEL
ANION GAP: 8 (ref 5–15)
BUN: 12 mg/dL (ref 6–20)
CALCIUM: 8.6 mg/dL — AB (ref 8.9–10.3)
CO2: 23 mmol/L (ref 22–32)
CREATININE: 0.74 mg/dL (ref 0.44–1.00)
Chloride: 102 mmol/L (ref 101–111)
GFR calc Af Amer: >60 mL/min (ref >60)
GLUCOSE: 92 mg/dL (ref 65–99)
Potassium: 3.3 mmol/L — ABNORMAL LOW (ref 3.5–5.1)
Sodium: 133 mmol/L — ABNORMAL LOW (ref 135–145)

## 2016-12-13 LAB — I-STAT BETA HCG BLOOD, ED (MC, WL, AP ONLY): I-stat hCG, quantitative: <5 m[IU]/mL (ref <5)

## 2016-12-13 LAB — I-STAT TROPONIN, ED: TROPONIN I, POC: 0 ng/mL (ref 0.00–0.08)

## 2016-12-13 LAB — CBG MONITORING, ED: GLUCOSE-CAPILLARY: 65 mg/dL (ref 65–99)

## 2016-12-13 NOTE — ED Triage Notes (Addendum)
Pt complaint of "feeling like I am going to pass out" over past week; "believes it could be my blood pressure because they say it is high." Pt denies previous treatment/prescription for hypertension. Pt continues to verbalize intermittent left chest shooting pain onset same as above.

## 2016-12-13 NOTE — Discharge Instructions (Signed)
Return to the emergency department with any new concerns or worsening symptoms.

## 2016-12-13 NOTE — ED Notes (Signed)
Pt taken for urine specimen collection.

## 2016-12-13 NOTE — ED Provider Notes (Signed)
[facility]-EMERGENCY DEPT Provider Note   CSN: [id number] Arrival date & time: 12/13/16  1816 By signing my name below, I, Levon HedgerElizabeth Hall, attest that this documentation has been prepared under the direction and in the presence of non-physician practitioner, Melvenia BeamShari A Valoria Tamburri PA-C. Electronically Signed: Levon HedgerElizabeth Hall, Scribe. 12/13/2016. 10:22 PM.   History   Chief Complaint Chief Complaint  Patient presents with  . Near Syncope   HPI Maria Flores is an otherwise healthy 21 y.o. female who presents to the Emergency Department complaining of intermittent, sudden onset dizziness onset this week. Dizziness is exacerbated by lying down; no alleviating factors noted. No treatments tried PTA. She notes associated chest tightness, left arm and calf numbness, mild blurred vision, and sharp chest pain. Per pt, her sharp, shooting chest has since resolved. She has never had this problem before. The patient is currently on no regular medications, including birth control. She is a nonsmoker. No recent travel. Pt denies any hx of HTN or DM. She denies any nausea, vomiting, diarrhea, SOB, cough, fever, or leg swelling. Pt has no other complaints or symptoms at this time.   The history is provided by the patient. No language interpreter was used.    Past Medical History:  Diagnosis Date  . Environmental allergies   . Environmental allergies     There are no active problems to display for this patient.   History reviewed. No pertinent surgical history.  OB History    No data available     Home Medications    Prior to Admission medications   Not on File   Family History Family History  Problem Relation Age of Onset  . Cancer Neg Hx   . Diabetes Neg Hx   . Heart failure Neg Hx   . Hyperlipidemia Neg Hx   . Hypertension Neg Hx     Social History Social History  Substance Use Topics  . Smoking status: Never Smoker  . Smokeless tobacco: Not on file  . Alcohol use No     Allergies     Tomato and Apple   Review of Systems Review of Systems  Constitutional: Negative for fever.  Eyes: Positive for visual disturbance.  Respiratory: Positive for chest tightness. Negative for cough and shortness of breath.   Cardiovascular: Positive for chest pain. Negative for leg swelling.  Gastrointestinal: Negative for diarrhea, nausea and vomiting.  Allergic/Immunologic: Negative for immunocompromised state.  Neurological: Positive for dizziness and weakness.    Physical Exam Updated Vital Signs BP 135/84 (BP Location: Left Arm)   Pulse 74   Temp 98 F (36.7 C) (Oral)   Resp 18   Ht 5\' 6"  (1.676 m)   Wt 254 lb 14.4 oz (115.6 kg)   LMP 12/04/2016   SpO2 100%   BMI 41.14 kg/m   Physical Exam  Constitutional: She is oriented to person, place, and time. She appears well-developed and well-nourished. No distress.  HENT:  Head: Normocephalic and atraumatic.  Eyes: Conjunctivae are normal.  Neck: Normal range of motion. Carotid bruit is not present.  Cardiovascular: Normal rate and regular rhythm.   No murmur heard. No carotid bruit.  Pulmonary/Chest: Effort normal. She has no wheezes. She has no rales. She exhibits no tenderness.  Abdominal: Soft. She exhibits no distension. There is no tenderness.  Musculoskeletal: Normal range of motion. She exhibits no edema.  Neurological: She is alert and oriented to person, place, and time. No sensory deficit. She exhibits normal muscle tone. Coordination normal.  Skin:  Skin is warm and dry.  Psychiatric: She has a normal mood and affect.  Nursing note and vitals reviewed.  ED Treatments / Results  DIAGNOSTIC STUDIES:  Oxygen Saturation is 100% on room air, normal by my interpretation.    COORDINATION OF CARE:  10:22 PM Discussed treatment plan with pt at bedside and pt agreed to plan.   Labs (all labs ordered are listed, but only abnormal results are displayed) Labs Reviewed  BASIC METABOLIC PANEL - Abnormal; Notable for  the following:       Result Value   Sodium 133 (*)    Potassium 3.3 (*)    Calcium 8.6 (*)    All other components within normal limits  URINALYSIS, ROUTINE W REFLEX MICROSCOPIC - Abnormal; Notable for the following:    APPearance HAZY (*)    All other components within normal limits  CBC  CBG MONITORING, ED  I-STAT TROPOININ, ED  I-STAT BETA HCG BLOOD, ED ([facility], [facility], [facility] ONLY)    EKG  EKG Interpretation  Date/Time:  Friday December 13 2016 18:26:59 EST Ventricular Rate:  78 PR Interval:    QRS Duration: 99 QT Interval:  375 QTC Calculation: 428 R Axis:   70 Text Interpretation:  Sinus rhythm No old tracing to compare Confirmed by Ethelda Chick  MD, SAM [phone]) on 12/13/2016 6:32:11 PM       Radiology No results found.  Procedures Procedures (including critical care time)  Medications Ordered in ED Medications - No data to display   Initial Impression / Assessment and Plan / ED Course  I have reviewed the triage vital signs and the nursing notes.  Pertinent labs & imaging results that were available during my care of the patient were reviewed by me and considered in my medical decision making (see chart for details).     Patient presents with complaint of lightheadedness/dizziness associated with chest discomfort extending to left arm. She reports intermittent, mild SOB. No cough or fever.   She appears in NAD, very comfortable. VSS/normal. EKG is sinus rhythm and labs are reassuring. Given age, state of health and negative work up, doubt ACS. PE considered, however, the patient is PERC negative. CXR ordered along with orthostatic vital signs.  The patient states she wants to leave, that she feels better. She declines any further evaluation. She is encouraged to return with any worsening symptoms or new concerns.   Final Clinical Impressions(s) / ED Diagnoses   Final diagnoses:  None   1. Nonspecific chest pain 2. Dizziness   New Prescriptions New Prescriptions    No medications on file  I personally performed the services described in this documentation, which was scribed in my presence. The recorded information has been reviewed and is accurate.      Elpidio Anis, PA-C 12/13/16 2255    Doug Sou, MD 12/14/16 [phone]

## 2018-01-21 IMAGING — CR DG CHEST 2V
2 series · 2 of 2 positions shown · non-contrast
Comparison: None.

CLINICAL DATA: Recent diagnosis of strep throat with persistent
cough, initial encounter

EXAM:
CHEST  2 VIEW

[w chest pa]
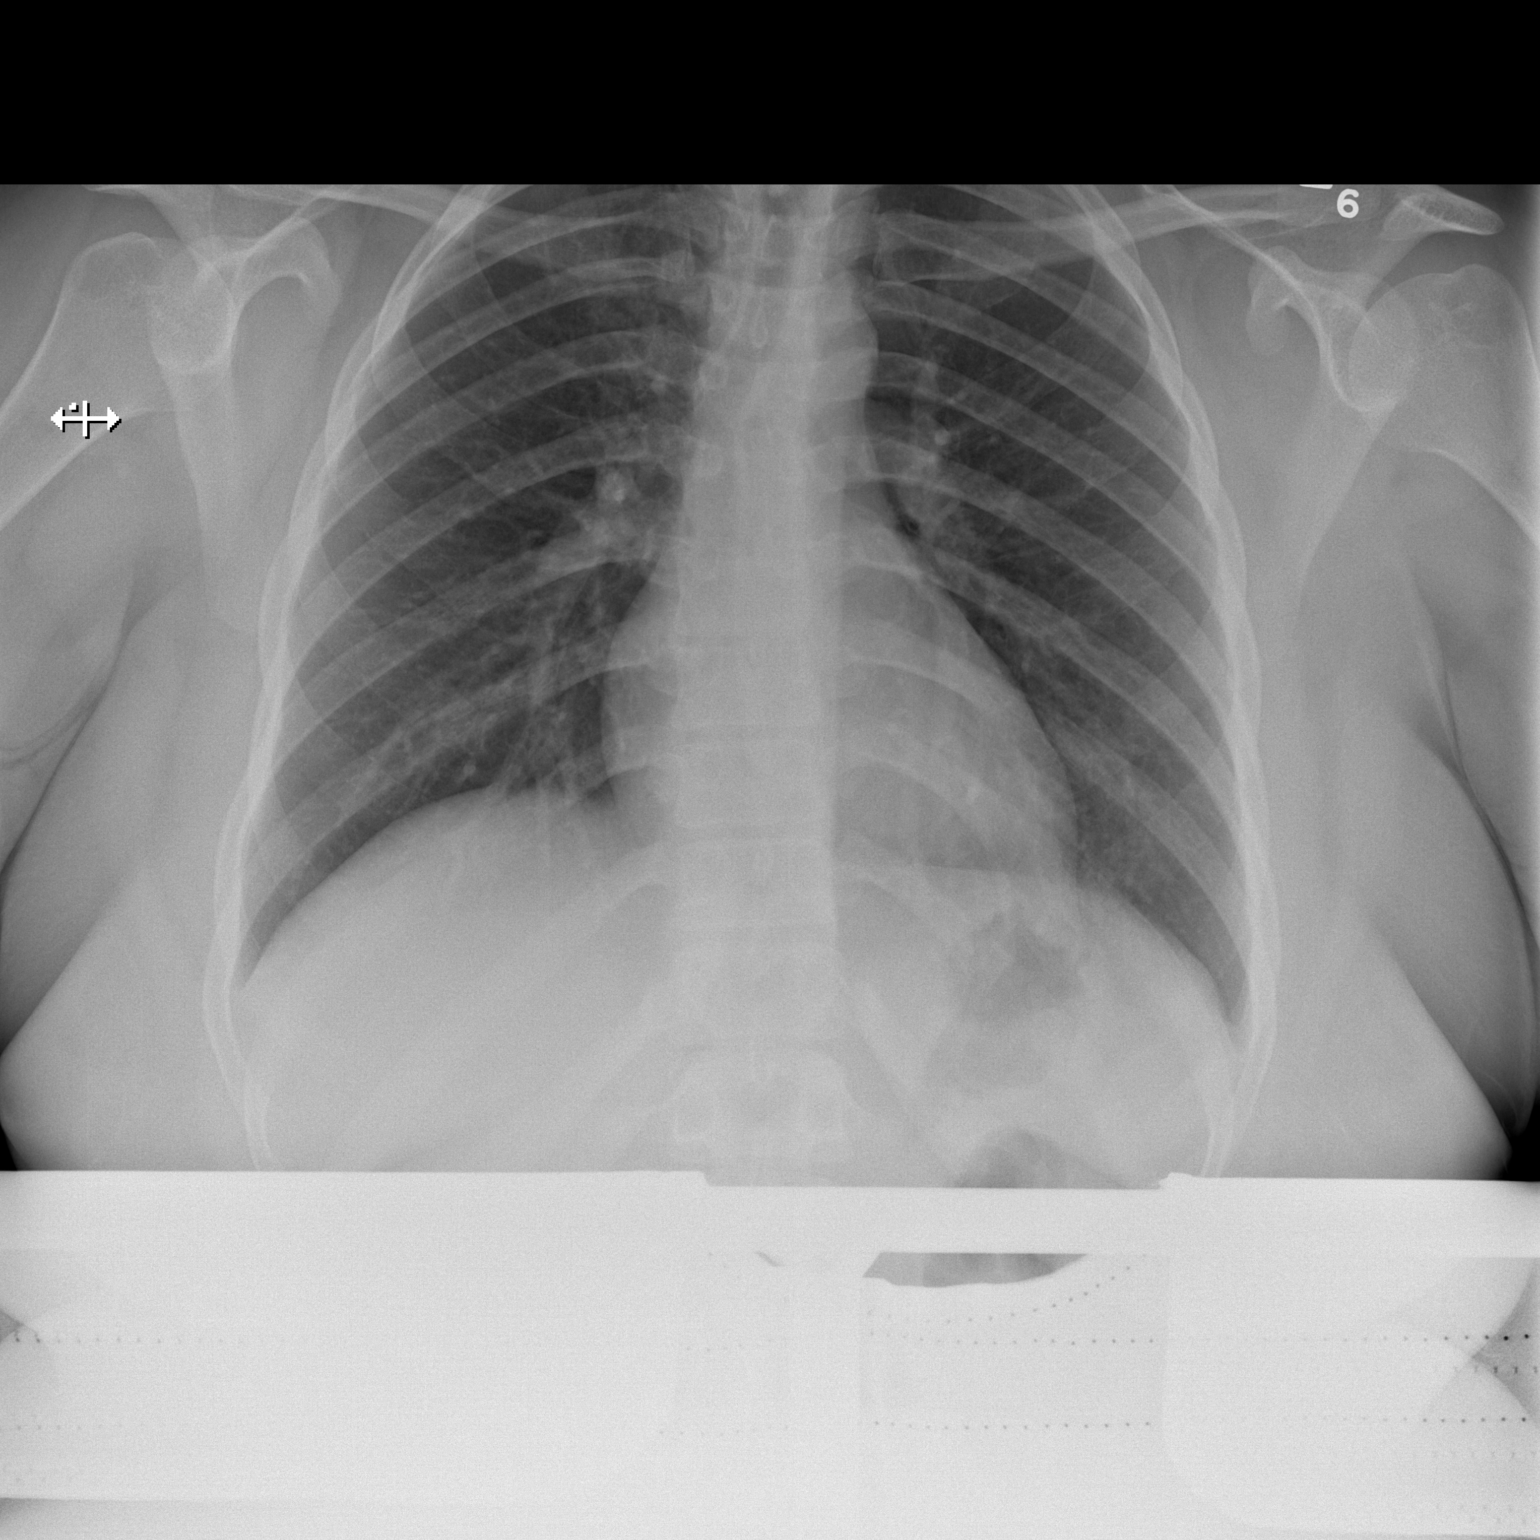

[w chest lat]
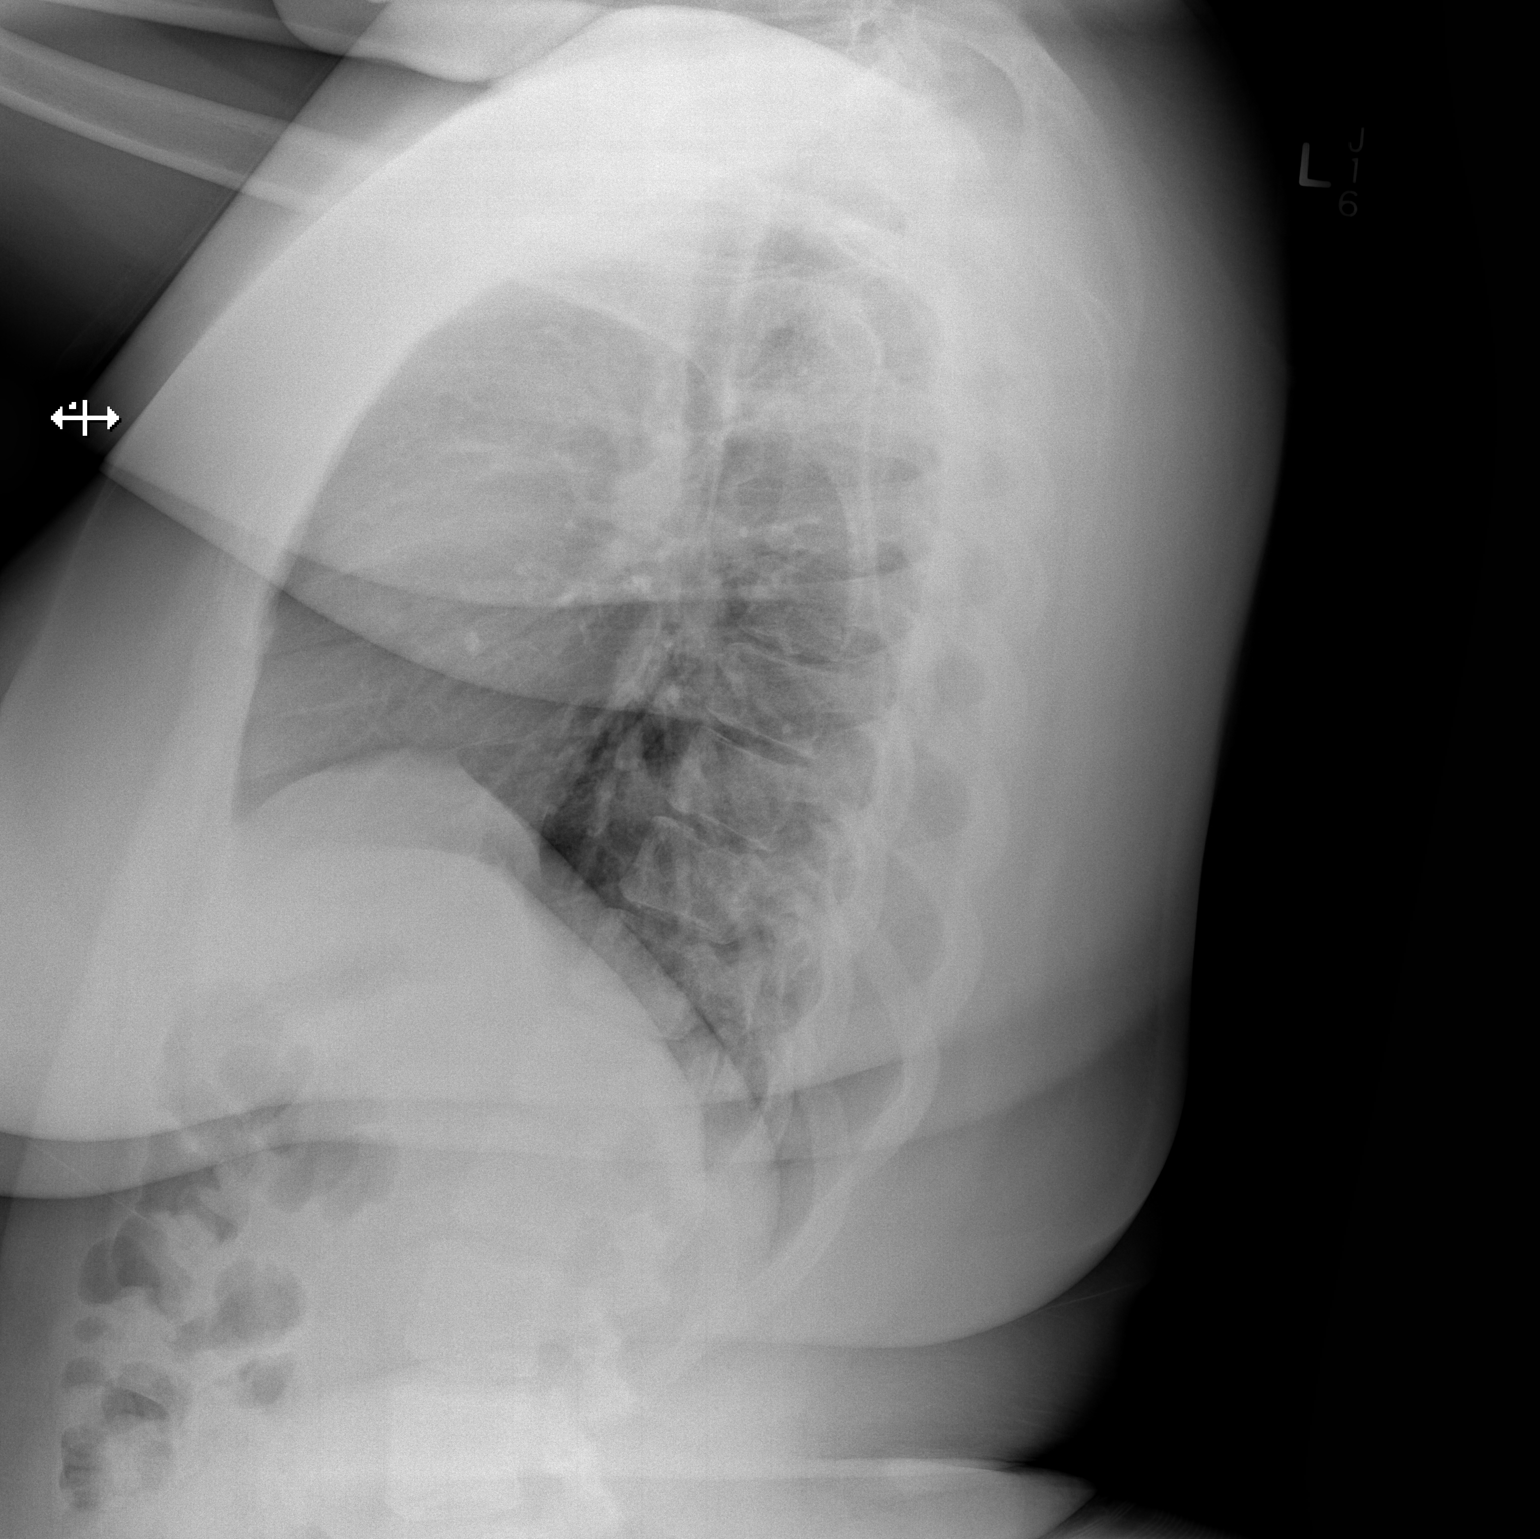

[2 of 2 positions shown; findings below may reference images not displayed]

FINDINGS: The heart size and mediastinal contours are within normal limits.
Both lungs are clear. The visualized skeletal structures are
unremarkable.
IMPRESSION: No active cardiopulmonary disease.

## 2018-02-08 ENCOUNTER — Emergency Department (HOSPITAL_COMMUNITY): Payer: 59

## 2018-02-08 ENCOUNTER — Emergency Department (HOSPITAL_COMMUNITY)
Admission: EM | Admit: 2018-02-08 | Discharge: 2018-02-08 | Disposition: A | Discharge status: Home / Self Care | Payer: 59 | Attending: Emergency Medicine | Admitting: Emergency Medicine

## 2018-02-08 ENCOUNTER — Encounter (HOSPITAL_COMMUNITY): Payer: Self-pay | Admitting: Advanced Practice Midwife

## 2018-02-08 ENCOUNTER — Other Ambulatory Visit: Payer: Self-pay

## 2018-02-08 ENCOUNTER — Inpatient Hospital Stay (EMERGENCY_DEPARTMENT_HOSPITAL)
Admission: AD | Admit: 2018-02-08 | Discharge: 2018-02-08 | Disposition: A | Discharge status: Home / Self Care | Payer: 59 | Source: Ambulatory Visit | Attending: Obstetrics and Gynecology | Admitting: Obstetrics and Gynecology

## 2018-02-08 ENCOUNTER — Encounter (HOSPITAL_COMMUNITY): Payer: Self-pay | Admitting: Emergency Medicine

## 2018-02-08 DIAGNOSIS — O283 Abnormal ultrasonic finding on antenatal screening of mother: Secondary | ICD-10-CM

## 2018-02-08 DIAGNOSIS — Z5321 Procedure and treatment not carried out due to patient leaving prior to being seen by health care provider: Secondary | ICD-10-CM | POA: Diagnosis not present

## 2018-02-08 DIAGNOSIS — O3680X0 Pregnancy with inconclusive fetal viability, not applicable or unspecified: Secondary | ICD-10-CM

## 2018-02-08 DIAGNOSIS — Z3A01 Less than 8 weeks gestation of pregnancy: Secondary | ICD-10-CM | POA: Insufficient documentation

## 2018-02-08 DIAGNOSIS — N939 Abnormal uterine and vaginal bleeding, unspecified: Secondary | ICD-10-CM | POA: Insufficient documentation

## 2018-02-08 DIAGNOSIS — O209 Hemorrhage in early pregnancy, unspecified: Secondary | ICD-10-CM

## 2018-02-08 DIAGNOSIS — O10011 Pre-existing essential hypertension complicating pregnancy, first trimester: Secondary | ICD-10-CM | POA: Insufficient documentation

## 2018-02-08 DIAGNOSIS — I10 Essential (primary) hypertension: Secondary | ICD-10-CM | POA: Diagnosis present

## 2018-02-08 HISTORY — DX: Anxiety disorder, unspecified: F41.9

## 2018-02-08 HISTORY — DX: Essential (primary) hypertension: I10

## 2018-02-08 LAB — CBC
HCT: 39.3 % (ref 36.0–46.0)
Hemoglobin: 13.2 g/dL (ref 12.0–15.0)
MCH: 31.7 pg (ref 26.0–34.0)
MCHC: 33.6 g/dL (ref 30.0–36.0)
MCV: 94.5 fL (ref 78.0–100.0)
PLATELETS: 174 10*3/uL (ref 150–400)
RBC: 4.16 MIL/uL (ref 3.87–5.11)
RDW: 14.2 % (ref 11.5–15.5)
WBC: 5.3 10*3/uL (ref 4.0–10.5)

## 2018-02-08 LAB — URINALYSIS, ROUTINE W REFLEX MICROSCOPIC
BILIRUBIN URINE: NEGATIVE
Glucose, UA: NEGATIVE mg/dL
KETONES UR: NEGATIVE mg/dL
LEUKOCYTES UA: NEGATIVE
NITRITE: NEGATIVE
PROTEIN: 30 mg/dL — AB
Specific Gravity, Urine: 1.014 (ref 1.005–1.030)
pH: 7 (ref 5.0–8.0)

## 2018-02-08 LAB — WET PREP, GENITAL
Sperm: NONE SEEN
TRICH WET PREP: NONE SEEN
YEAST WET PREP: NONE SEEN

## 2018-02-08 LAB — ABO/RH: ABO/RH(D): O POS

## 2018-02-08 LAB — HCG, QUANTITATIVE, PREGNANCY: hCG, Beta Chain, Quant, S: 3744 m[IU]/mL — ABNORMAL HIGH (ref <5)

## 2018-02-08 NOTE — MAU Provider Note (Signed)
History     CSN: [id number]  Arrival date and time: 02/08/18 1220   First Provider Initiated Contact with Patient 02/08/18 1252      Chief Complaint  Patient presents with  . Vaginal Bleeding   Vaginal Bleeding  The patient's primary symptoms include pelvic pain and vaginal bleeding. This is a new problem. The current episode started yesterday. The problem occurs constantly. The problem has been gradually worsening. Pain severity now: 5/10. The problem affects both sides. She is pregnant. Associated symptoms include nausea. Pertinent negatives include no chills, dysuria, fever, frequency, urgency or vomiting. The vaginal discharge was bloody. The vaginal bleeding is lighter than menses. She has been passing clots (one clot this morning about the size of a dime. ). She has not been passing tissue. The symptoms are aggravated by intercourse (Patient reports intercourse within the last 2 days. ). She has tried nothing for the symptoms. She uses nothing for contraception. Her menstrual history has been irregular (LMP 12/24/17).   Past Medical History:  Diagnosis Date  . Anxiety   . Environmental allergies   . Environmental allergies   . Hypertension     Past Surgical History:  Procedure Laterality Date  . NO PAST SURGERIES      Family History  Problem Relation Age of Onset  . Cancer Neg Hx   . Diabetes Neg Hx   . Heart failure Neg Hx   . Hyperlipidemia Neg Hx   . Hypertension Neg Hx     Social History   Tobacco Use  . Smoking status: Former Smoker    Last attempt to quit: 02/08/2018  . Smokeless tobacco: Never Used  Substance Use Topics  . Alcohol use: No  . Drug use: No    Allergies:  Allergies  Allergen Reactions  . Tomato Itching, Swelling and Other (See Comments)    Reaction:  Lip swelling   . Apple Itching, Swelling and Other (See Comments)    Reaction:  Lip swelling     No medications prior to admission.    Review of Systems  Constitutional: Negative for  chills and fever.  Gastrointestinal: Positive for nausea. Negative for vomiting.  Genitourinary: Positive for pelvic pain and vaginal bleeding. Negative for dysuria, frequency and urgency.   Physical Exam   Blood pressure 140/72, pulse 76, temperature 98.7 F (37.1 C), temperature source Oral, resp. rate 16, height  (1.651 m), weight 271 lb (122.9 kg), last menstrual period 12/24/2017.  Physical Exam  Nursing note and vitals reviewed. Constitutional: She is oriented to person, place, and time. She appears well-developed and well-nourished. No distress.  HENT:  Head: Normocephalic.  Cardiovascular: Normal rate.  Respiratory: Effort normal.  GI: Soft. There is no tenderness.  Genitourinary:  Genitourinary Comments:  External: no lesion Vagina: small amount of blood noted  Cervix: pink, smooth, no CMT, closed/thick  Uterus: NSSC Adnexa: difficulty to assess 2/2 body habitus    Neurological: She is alert and oriented to person, place, and time.  Skin: Skin is warm and dry.  Psychiatric: She has a normal mood and affect.   Results for orders placed or performed during the hospital encounter of 02/08/18 (from the past 24 hour(s))  Urinalysis, Routine w reflex microscopic     Status: Abnormal   Collection Time: 02/08/18 12:27 PM  Result Value Ref Range   Color, Urine YELLOW YELLOW   APPearance HAZY (A) CLEAR   Specific Gravity, Urine 1.014 1.005 - 1.030   pH 7.0 5.0 - 8.0  Glucose, UA NEGATIVE NEGATIVE mg/dL   Hgb urine dipstick LARGE (A) NEGATIVE   Bilirubin Urine NEGATIVE NEGATIVE   Ketones, ur NEGATIVE NEGATIVE mg/dL   Protein, ur 30 (A) NEGATIVE mg/dL   Nitrite NEGATIVE NEGATIVE   Leukocytes, UA NEGATIVE NEGATIVE   RBC / HPF 0-5 0 - 5 RBC/hpf   WBC, UA 0-5 0 - 5 WBC/hpf   Bacteria, UA RARE (A) NONE SEEN   Squamous Epithelial / LPF 6-10 0 - 5   Mucus PRESENT   CBC     Status: None   Collection Time: 02/08/18 12:47 PM  Result Value Ref Range   WBC 5.3 4.0 - 10.5  K/uL   RBC 4.16 3.87 - 5.11 MIL/uL   Hemoglobin 13.2 12.0 - 15.0 g/dL   HCT 16.1 09.6 - 04.5 %   MCV 94.5 78.0 - 100.0 fL   MCH 31.7 26.0 - 34.0 pg   MCHC 33.6 30.0 - 36.0 g/dL   RDW 40.9 81.1 - 91.4 %   Platelets 174 150 - 400 K/uL  ABO/Rh     Status: None (Preliminary result)   Collection Time: 02/08/18 12:47 PM  Result Value Ref Range   ABO/RH(D)      O POS Performed at [facility], [address] [place], [place] [address]   Wet prep, genital     Status: Abnormal   Collection Time: 02/08/18 12:50 PM  Result Value Ref Range   Yeast Wet Prep HPF POC NONE SEEN NONE SEEN   Trich, Wet Prep NONE SEEN NONE SEEN   Clue Cells Wet Prep HPF POC PRESENT (A) NONE SEEN   WBC, Wet Prep HPF POC FEW (A) NONE SEEN   Sperm NONE SEEN    US Ob Comp Less 14 Wks  Result Date: 02/08/2018 CLINICAL DATA:  Vaginal bleeding in 1st trimester pregnancy. Pelvic cramping. EXAM: OBSTETRIC <14 WK [place] AND TRANSVAGINAL OB US TECHNIQUE: Both transabdominal and transvaginal ultrasound examinations were performed for complete evaluation of the gestation as well as the maternal uterus, adnexal regions, and pelvic cul-de-sac. Transvaginal technique was performed to assess early pregnancy. COMPARISON:  None. FINDINGS: Intrauterine gestational sac: Possible early tiny intrauterine gestational sac Yolk sac:  Not Visualized. Embryo:  Not Visualized. MSD: 3 mm   4 w   6 d Subchorionic hemorrhage:  None visualized. Maternal uterus/adnexae: Normal appearance of both ovaries. No adnexal mass identified. Tiny amount of simple free fluid in pelvic cul-de-sac. IMPRESSION: Suspect single early intrauterine gestational sac measuring approximately 5 weeks. Consider following serial b-hCG levels, with followup ultrasound to assess viability in 14 days. No adnexal mass identified. Electronically Signed   By: Myles Rosenthal M.D.   On: 02/08/2018 14:22   US Ob Transvaginal  Result Date: 02/08/2018 CLINICAL DATA:  Vaginal bleeding in 1st  trimester pregnancy. Pelvic cramping. EXAM: OBSTETRIC <14 WK [place] AND TRANSVAGINAL OB US TECHNIQUE: Both transabdominal and transvaginal ultrasound examinations were performed for complete evaluation of the gestation as well as the maternal uterus, adnexal regions, and pelvic cul-de-sac. Transvaginal technique was performed to assess early pregnancy. COMPARISON:  None. FINDINGS: Intrauterine gestational sac: Possible early tiny intrauterine gestational sac Yolk sac:  Not Visualized. Embryo:  Not Visualized. MSD: 3 mm   4 w   6 d Subchorionic hemorrhage:  None visualized. Maternal uterus/adnexae: Normal appearance of both ovaries. No adnexal mass identified. Tiny amount of simple free fluid in pelvic cul-de-sac. IMPRESSION: Suspect single early intrauterine gestational sac measuring approximately 5 weeks. Consider following serial b-hCG levels,  with followup ultrasound to assess viability in 14 days. No adnexal mass identified. Electronically Signed   By: Myles Rosenthal M.D.   On: 02/08/2018 14:22    MAU Course  Procedures  MDM   Assessment and Plan   1. Pregnancy, location unknown   2. Vaginal bleeding in pregnancy, first trimester   3. Chronic hypertension    DC home Bleeding precautions Ectopic precautions RX: none  Return to MAU as needed FU with OB as planned  Follow-up Information    [facility] Follow up.   Specialty:  Obstetrics and Gynecology Why:  TUESDAY 02/10/18 AT 11:00AM for repeat blood work  Contact information: [address] [place] [address] [phone]           Thressa Sheller 02/08/2018, 12:53 PM

## 2018-02-08 NOTE — ED Triage Notes (Signed)
Pt reports vaginal bleeding since yestedray after taking positive pregnancy test. Reports is light pink in color. Not wearing any pads.

## 2018-02-08 NOTE — Discharge Instructions (Signed)
Ectopic Pregnancy An ectopic pregnancy is when the fertilized egg attaches (implants) outside the uterus. Most ectopic pregnancies occur in one of the tubes where eggs travel from the ovary to the uterus (fallopian tubes), but the implanting can occur in other locations. In rare cases, ectopic pregnancies occur on the ovary, intestine, pelvis, abdomen, or cervix. In an ectopic pregnancy, the fertilized egg does not have the ability to develop into a normal, healthy baby. A ruptured ectopic pregnancy is one in which tearing or bursting of a fallopian tube causes internal bleeding. Often, there is intense lower abdominal pain, and vaginal bleeding sometimes occurs. Having an ectopic pregnancy can be life-threatening. If this dangerous condition is not treated, it can lead to blood loss, shock, or even death. What are the causes? The most common cause of this condition is damage to one of the fallopian tubes. A fallopian tube may be narrowed or blocked, and that keeps the fertilized egg from reaching the uterus. What increases the risk? This condition is more likely to develop in women of childbearing age who have different levels of risk. The levels of risk can be divided into three categories. High risk  You have gone through infertility treatment.  You have had an ectopic pregnancy before.  You have had surgery on the fallopian tubes, or another surgical procedure, such as an abortion.  You have had surgery to have the fallopian tubes tied (tubal ligation).  You have problems or diseases of the fallopian tubes.  You have been exposed to diethylstilbestrol (DES). This medicine was used until 1971, and it had effects on babies whose mothers took the medicine.  You become pregnant while using an IUD (intrauterine device) for birth control. Moderate risk  You have a history of infertility.  You have had an STI (sexually transmitted infection).  You have a history of pelvic inflammatory  disease (PID).  You have scarring from endometriosis.  You have multiple sexual partners.  You smoke. Low risk  You have had pelvic surgery.  You use vaginal douches.  You became sexually active before age 18. What are the signs or symptoms? Common symptoms of this condition include normal pregnancy symptoms, such as missing a period, nausea, tiredness, abdominal pain, breast tenderness, and bleeding. However, ectopic pregnancy will have additional symptoms, such as:  Pain with intercourse.  Irregular vaginal bleeding or spotting.  Cramping or pain on one side or in the lower abdomen.  Fast heartbeat, low blood pressure, and sweating.  Passing out while having a bowel movement.  Symptoms of a ruptured ectopic pregnancy and internal bleeding may include:  Sudden, severe pain in the abdomen and pelvis.  Dizziness, weakness, light-headedness, or fainting.  Pain in the shoulder or neck area.  How is this diagnosed? This condition is diagnosed by:  A pelvic exam to locate pain or a mass in the abdomen.  A pregnancy test. This blood test checks for the presence as well as the specific level of pregnancy hormone in the bloodstream.  Ultrasound. This is performed if a pregnancy test is positive. In this test, a probe is inserted into the vagina. The probe will detect a fetus, possibly in a location other than the uterus.  Taking a sample of uterus tissue (dilation and curettage, or D&C).  Surgery to perform a visual exam of the inside of the abdomen using a thin, lighted tube that has a tiny camera on the end (laparoscope).  Culdocentesis. This procedure involves inserting a needle at the top   of the vagina, behind the uterus. If blood is present in this area, it may indicate that a fallopian tube is torn.  How is this treated? This condition is treated with medicine or surgery. Medicine  An injection of a medicine (methotrexate) may be given to cause the pregnancy tissue  to be absorbed. This medicine may save your fallopian tube. It may be given if: ? The diagnosis is made early, with no signs of active bleeding. ? The fallopian tube has not ruptured. ? You are considered to be a good candidate for the medicine. Usually, pregnancy hormone blood levels are checked after methotrexate treatment. This is to be sure that the medicine is effective. It may take 4-6 weeks for the pregnancy to be absorbed. Most pregnancies will be absorbed by 3 weeks. Surgery  A laparoscope may be used to remove the pregnancy tissue.  If severe internal bleeding occurs, a larger cut (incision) may be made in the lower abdomen (laparotomy) to remove the fetus and placenta. This is done to stop the bleeding.  Part or all of the fallopian tube may be removed (salpingectomy) along with the fetus and placenta. The fallopian tube may also be repaired during the surgery.  In very rare circumstances, removal of the uterus (hysterectomy) may be required.  After surgery, pregnancy hormone testing may be done to be sure that there is no pregnancy tissue left. Whether your treatment is medicine or surgery, you may receive a Rho (D) immune globulin shot to prevent problems with any future pregnancy. This shot may be given if:  You are Rh-negative and the baby's father is Rh-positive.  You are Rh-negative and you do not know the Rh type of the baby's father.  Follow these instructions at home:  Rest and limit your activity after the procedure for as long as told by your health care provider.  Until your health care provider says that it is safe: ? Do not lift anything that is heavier than 10 lb (4.5 kg), or the limit that your health care provider tells you. ? Avoid physical exercise and any movement that requires effort (is strenuous).  To help prevent constipation: ? Eat a healthy diet that includes fruits, vegetables, and whole grains. ? Drink 6-8 glasses of water per day. Get help  right away if:  You develop worsening pain that is not relieved by medicine.  You have: ? A fever or chills. ? Vaginal bleeding. ? Redness and swelling at the incision site. ? Nausea and vomiting.  You feel dizzy or weak.  You feel light-headed or you faint. This information is not intended to replace advice given to you by your health care provider. Make sure you discuss any questions you have with your health care provider. Document Released: 11/07/2004 Document Revised: 05/29/2016 Document Reviewed: 05/01/2016 Elsevier Interactive Patient Education  2018 [employer]  

## 2018-02-08 NOTE — ED Notes (Signed)
When pt called patient several times from lobby no answer, pop up comes up stating patient over at [facility]

## 2018-02-08 NOTE — MAU Note (Signed)
+  Hpt 2 weeks ago, sm,vag pink bleeding yesterday, woke up this morning with dark red blood , small clots, and lower abd cramping. Has first appt at health dept on the 30th.

## 2018-02-09 ENCOUNTER — Inpatient Hospital Stay (HOSPITAL_COMMUNITY)
Admission: AD | Admit: 2018-02-09 | Discharge: 2018-02-09 | Disposition: A | Discharge status: Home / Self Care | Payer: 59 | Source: Ambulatory Visit | Attending: Obstetrics & Gynecology | Admitting: Obstetrics & Gynecology

## 2018-02-09 DIAGNOSIS — R109 Unspecified abdominal pain: Secondary | ICD-10-CM | POA: Diagnosis present

## 2018-02-09 DIAGNOSIS — Z5321 Procedure and treatment not carried out due to patient leaving prior to being seen by health care provider: Secondary | ICD-10-CM | POA: Insufficient documentation

## 2018-02-09 DIAGNOSIS — Z3A01 Less than 8 weeks gestation of pregnancy: Secondary | ICD-10-CM | POA: Insufficient documentation

## 2018-02-09 DIAGNOSIS — O26891 Other specified pregnancy related conditions, first trimester: Secondary | ICD-10-CM | POA: Insufficient documentation

## 2018-02-09 LAB — GC/CHLAMYDIA PROBE AMP (~~LOC~~) NOT AT ARMC
CHLAMYDIA, DNA PROBE: NEGATIVE
NEISSERIA GONORRHEA: NEGATIVE

## 2018-02-09 NOTE — MAU Note (Signed)
Not in Lobby 

## 2018-02-09 NOTE — MAU Note (Signed)
Not in lobby

## 2018-02-09 NOTE — MAU Note (Addendum)
In the bathroom, throwing up.

## 2018-02-09 NOTE — MAU Note (Signed)
Pt. States she was here yesterday with light bleed, [redacted]wks pregnant, today bleeding is heavier and not feeling well, cramping a lot, would like something for pain

## 2018-02-10 ENCOUNTER — Ambulatory Visit (INDEPENDENT_AMBULATORY_CARE_PROVIDER_SITE_OTHER): Payer: 59 | Admitting: General Practice

## 2018-02-10 DIAGNOSIS — O283 Abnormal ultrasonic finding on antenatal screening of mother: Secondary | ICD-10-CM

## 2018-02-10 DIAGNOSIS — O3680X0 Pregnancy with inconclusive fetal viability, not applicable or unspecified: Secondary | ICD-10-CM

## 2018-02-10 LAB — HCG, QUANTITATIVE, PREGNANCY: hCG, Beta Chain, Quant, S: 249 m[IU]/mL — ABNORMAL HIGH (ref <5)

## 2018-02-10 NOTE — Progress Notes (Signed)
Patient here for stat bhcg today. Patient denies pain but reports bleeding is like a regular period. Discussed with patient we are monitoring her bhcg levels today and asked she wait in lobby for results/updated plan of care. Patient verbalized understanding & had no questions at this time.  Reviewed results with Thressa Sheller who finds decreasing bhcg levels indicating SAB. Patient should have follow up bhcg in 1 week and provider visit in 2 weeks.  Informed patient of results & discussed follow up. Patient verbalized understanding and had no questions.

## 2018-02-10 NOTE — Progress Notes (Signed)
I have reviewed the chart and agree with nursing staff's documentation of this patient's encounter.  Andrienne Havener, CNM 02/10/2018 6:44 PM    

## 2018-12-23 ENCOUNTER — Encounter (HOSPITAL_COMMUNITY): Payer: Self-pay

## 2019-08-03 ENCOUNTER — Other Ambulatory Visit: Payer: Self-pay

## 2019-08-03 ENCOUNTER — Ambulatory Visit
Admission: EM | Admit: 2019-08-03 | Discharge: 2019-08-03 | Disposition: A | Discharge status: Home / Self Care | Payer: 59 | Attending: Emergency Medicine | Admitting: Emergency Medicine

## 2019-08-03 DIAGNOSIS — J4521 Mild intermittent asthma with (acute) exacerbation: Secondary | ICD-10-CM | POA: Diagnosis not present

## 2019-08-03 DIAGNOSIS — R112 Nausea with vomiting, unspecified: Secondary | ICD-10-CM

## 2019-08-03 DIAGNOSIS — R059 Cough, unspecified: Secondary | ICD-10-CM

## 2019-08-03 DIAGNOSIS — J309 Allergic rhinitis, unspecified: Secondary | ICD-10-CM

## 2019-08-03 DIAGNOSIS — R05 Cough: Secondary | ICD-10-CM

## 2019-08-03 DIAGNOSIS — Z1159 Encounter for screening for other viral diseases: Secondary | ICD-10-CM

## 2019-08-03 MED ORDER — ONDANSETRON 4 MG PO TBDP: 4.0000 mg | ORAL_TABLET | Freq: Three times a day (TID) | ORAL | 0 refills | Status: DC | PRN

## 2019-08-03 MED ORDER — BENZONATATE 100 MG PO CAPS: 100.0000 mg | ORAL_CAPSULE | Freq: Three times a day (TID) | ORAL | 0 refills | Status: DC

## 2019-08-03 MED ORDER — PREDNISONE 20 MG PO TABS: 20.0000 mg | ORAL_TABLET | Freq: Every day | ORAL | 0 refills | Status: DC

## 2019-08-03 MED ORDER — ALBUTEROL SULFATE HFA 108 (90 BASE) MCG/ACT IN AERS: 2.0000 | INHALATION_SPRAY | RESPIRATORY_TRACT | 0 refills | Status: DC | PRN

## 2019-08-03 MED ORDER — FLUTICASONE PROPIONATE 50 MCG/ACT NA SUSP: 1.0000 | Freq: Every day | NASAL | 0 refills | Status: DC

## 2019-08-03 MED ORDER — LORATADINE 10 MG PO TABS: 10.0000 mg | ORAL_TABLET | Freq: Every day | ORAL | 0 refills | Status: DC

## 2019-08-03 NOTE — ED Triage Notes (Signed)
Pt c/o productive cough, SOB, and vomiting x2days

## 2019-08-03 NOTE — ED Provider Notes (Signed)
[facility]    CSN: [id number] Arrival date & time: 08/03/19  9211      History   Chief Complaint Chief Complaint  Patient presents with  . Cough    HPI Maria Flores is a 23 y.o. female with history of allergies, hypertension, asthma presenting for 2-day course of productive, nonhemoptic cough, shortness of breath, and 2 episodes of emesis.  Patient states she has had some nausea, though no abdominal pain.  Patient denies triggers for nausea.  Patient states that her cough is worsened by coughing and reports a scratching sensation in her throat.  Sputum is clear to white-colored.  Patient states that she used to have an albuterol inhaler, though ran out so has not been able to use it.  Patient also not been compliant with her allergy medications.  Patient denies known sick contacts.   Past Medical History:  Diagnosis Date  . Anxiety   . Environmental allergies   . Environmental allergies   . Hypertension     Patient Active Problem List   Diagnosis Date Noted  . Chronic hypertension 02/08/2018    Past Surgical History:  Procedure Laterality Date  . NO PAST SURGERIES      OB History    Gravida  1   Para      Term      Preterm      AB      Living        SAB      TAB      Ectopic      Multiple      Live Births               Home Medications    Prior to Admission medications   Medication Sig Start Date End Date Taking? Authorizing Provider  albuterol (VENTOLIN HFA) 108 (90 Base) MCG/ACT inhaler Inhale 2 puffs into the lungs every 4 (four) hours as needed for wheezing or shortness of breath. 08/03/19   Hall-Potvin, [place], PA-C  benzonatate (TESSALON) 100 MG capsule Take 1 capsule (100 mg total) by mouth every 8 (eight) hours. 08/03/19   Hall-Potvin, [place], PA-C  fluticasone (FLONASE) 50 MCG/ACT nasal spray Place 1 spray into both nostrils daily. 08/03/19   Hall-Potvin, [place], PA-C  loratadine (CLARITIN) 10 MG tablet  Take 1 tablet (10 mg total) by mouth daily. 08/03/19   Hall-Potvin, [place], PA-C  ondansetron (ZOFRAN ODT) 4 MG disintegrating tablet Take 1 tablet (4 mg total) by mouth every 8 (eight) hours as needed for nausea or vomiting. 08/03/19   Hall-Potvin, [place], PA-C  predniSONE (DELTASONE) 20 MG tablet Take 1 tablet (20 mg total) by mouth daily. 08/03/19   Hall-Potvin, [place], PA-C    Family History Family History  Problem Relation Age of Onset  . Cancer Neg Hx   . Diabetes Neg Hx   . Heart failure Neg Hx   . Hyperlipidemia Neg Hx   . Hypertension Neg Hx     Social History Social History   Tobacco Use  . Smoking status: Former Smoker    Quit date: 02/08/2018    Years since quitting: 1.4  . Smokeless tobacco: Never Used  Substance Use Topics  . Alcohol use: No  . Drug use: No     Allergies   Tomato and Apple   Review of Systems Review of Systems  Constitutional: Negative for activity change, appetite change, fatigue and fever.  HENT: Positive for congestion and postnasal drip. Negative for ear pain,  sinus pressure, sinus pain, sore throat, trouble swallowing and voice change.   Eyes: Negative for pain, redness and visual disturbance.  Respiratory: Positive for cough and shortness of breath. Negative for wheezing and stridor.   Cardiovascular: Negative for chest pain, palpitations and leg swelling.  Gastrointestinal: Negative for abdominal pain, diarrhea and vomiting.  Musculoskeletal: Negative for arthralgias and myalgias.  Skin: Negative for rash and wound.  Neurological: Negative for syncope and headaches.     Physical Exam Triage Vital Signs ED Triage Vitals [08/03/19 1011]  Enc Vitals Group     BP (!) 143/97     Pulse Rate 77     Resp 18     Temp 99.4 F (37.4 C)     Temp Source Oral     SpO2 97 %     Weight      Height      Head Circumference      Peak Flow      Pain Score 0     Pain Loc      Pain Edu?      Excl. in GC?    No data found.   Updated Vital Signs BP (!) 143/97 (BP Location: Left Arm)   Pulse 77   Temp 99.4 F (37.4 C) (Oral)   Resp 18   LMP 07/27/2019   SpO2 97%   Visual Acuity Right Eye Distance:   Left Eye Distance:   Bilateral Distance:    Right Eye Near:   Left Eye Near:    Bilateral Near:     Physical Exam Constitutional:      General: She is not in acute distress.    Appearance: She is obese. She is not toxic-appearing.  HENT:     Head: Normocephalic and atraumatic.     Jaw: There is normal jaw occlusion. No tenderness or pain on movement.     Right Ear: Hearing, tympanic membrane, ear canal and external ear normal. No tenderness. No mastoid tenderness.     Left Ear: Hearing, tympanic membrane, ear canal and external ear normal. No tenderness. No mastoid tenderness.     Nose: No nasal deformity, septal deviation or nasal tenderness.     Right Turbinates: Not swollen or pale.     Left Turbinates: Not swollen or pale.     Right Sinus: No maxillary sinus tenderness or frontal sinus tenderness.     Left Sinus: No maxillary sinus tenderness or frontal sinus tenderness.     Comments: Bilateral turbinate edema.  No sinus tenderness bilaterally    Mouth/Throat:     Lips: Pink. No lesions.     Mouth: Mucous membranes are moist. No injury.     Pharynx: Oropharynx is clear. Uvula midline. No posterior oropharyngeal erythema or uvula swelling.     Comments: no tonsillar exudate or hypertrophy.  Cobblestoning present Eyes:     General: No scleral icterus.    Conjunctiva/sclera: Conjunctivae normal.     Pupils: Pupils are equal, round, and reactive to light.  Neck:     Musculoskeletal: Normal range of motion and neck supple. No muscular tenderness.  Cardiovascular:     Rate and Rhythm: Normal rate and regular rhythm.     Heart sounds: No murmur.  Pulmonary:     Effort: Pulmonary effort is normal. No respiratory distress.     Breath sounds: No rhonchi or rales.     Comments: Trace end phase  expiratory wheezing diffusely.   Abdominal:     General: Bowel  sounds are normal. There is no distension.     Palpations: Abdomen is soft.     Tenderness: There is no abdominal tenderness.  Lymphadenopathy:     Cervical: No cervical adenopathy.  Skin:    General: Skin is warm.     Capillary Refill: Capillary refill takes less than 2 seconds.     Coloration: Skin is not jaundiced or pale.     Findings: No rash.  Neurological:     General: No focal deficit present.     Mental Status: She is alert and oriented to person, place, and time.      UC Treatments / Results  Labs (all labs ordered are listed, but only abnormal results are displayed) Labs Reviewed  NOVEL CORONAVIRUS, NAA    EKG   Radiology No results found.  Procedures Procedures (including critical care time)  Medications Ordered in UC Medications - No data to display  Initial Impression / Assessment and Plan / UC Course  I have reviewed the triage vital signs and the nursing notes.  Pertinent labs & imaging results that were available during my care of the patient were reviewed by me and considered in my medical decision making (see chart for details).     Radiography deferred due to brief duration of symptoms without focal findings on cardiopulmonary exam, lack of fever.  Given patient's history, risk factors, expiratory wheezing, will refill inhaler, start prednisone for asthma exacerbation, and benzonatate for reactive airway.  Covid test pending at patient's request due to concern regarding global pandemic.  Return precautions discussed, patient verbalized understanding and is agreeable to plan. Final Clinical Impressions(s) / UC Diagnoses   Final diagnoses:  Cough  Allergic rhinitis, unspecified seasonality, unspecified trigger  Non-intractable vomiting with nausea, unspecified vomiting type  Mild intermittent asthma with acute exacerbation     Discharge Instructions     Your COVID test is  pending: Is important you quarantine at home until your results are back. You may take OTC Tylenol for fever and myalgias.  It is important to drink plenty of water throughout the day to stay hydrated. If you test positive and later develop severe fever, cough, or shortness of breath, it is recommended that you go to the ER for further evaluation.    ED Prescriptions    Medication Sig Dispense Auth. Provider   fluticasone (FLONASE) 50 MCG/ACT nasal spray Place 1 spray into both nostrils daily. 16 g Hall-Potvin, [place], PA-C   benzonatate (TESSALON) 100 MG capsule Take 1 capsule (100 mg total) by mouth every 8 (eight) hours. 21 capsule Hall-Potvin, [place], PA-C   ondansetron (ZOFRAN ODT) 4 MG disintegrating tablet Take 1 tablet (4 mg total) by mouth every 8 (eight) hours as needed for nausea or vomiting. 21 tablet Hall-Potvin, [place], PA-C   albuterol (VENTOLIN HFA) 108 (90 Base) MCG/ACT inhaler Inhale 2 puffs into the lungs every 4 (four) hours as needed for wheezing or shortness of breath. 18 g Hall-Potvin, [place], PA-C   loratadine (CLARITIN) 10 MG tablet Take 1 tablet (10 mg total) by mouth daily. 30 tablet Hall-Potvin, [place], PA-C   predniSONE (DELTASONE) 20 MG tablet Take 1 tablet (20 mg total) by mouth daily. 5 tablet Hall-Potvin, [place], PA-C     PDMP not reviewed this encounter.   Hall-Potvin, [place], [place] 08/03/19 1617

## 2019-08-03 NOTE — Discharge Instructions (Addendum)
Your COVID test is pending: Is important you quarantine at home until your results are back. °You may take OTC Tylenol for fever and myalgias.  It is important to drink plenty of water throughout the day to stay hydrated. °If you test positive and later develop severe fever, cough, or shortness of breath, it is recommended that you go to the ER for further evaluation. °

## 2019-08-05 LAB — NOVEL CORONAVIRUS, NAA: SARS-CoV-2, NAA: NOT DETECTED

## 2019-10-28 ENCOUNTER — Ambulatory Visit
Admission: EM | Admit: 2019-10-28 | Discharge: 2019-10-28 | Disposition: A | Discharge status: Home / Self Care | Payer: Managed Care, Other (non HMO) | Attending: Physician Assistant | Admitting: Physician Assistant

## 2019-10-28 DIAGNOSIS — N920 Excessive and frequent menstruation with regular cycle: Secondary | ICD-10-CM | POA: Diagnosis present

## 2019-10-28 DIAGNOSIS — I10 Essential (primary) hypertension: Secondary | ICD-10-CM | POA: Diagnosis not present

## 2019-10-28 DIAGNOSIS — N946 Dysmenorrhea, unspecified: Secondary | ICD-10-CM | POA: Diagnosis present

## 2019-10-28 DIAGNOSIS — Z3202 Encounter for pregnancy test, result negative: Secondary | ICD-10-CM

## 2019-10-28 LAB — POCT URINE PREGNANCY: Preg Test, Ur: NEGATIVE

## 2019-10-28 MED ORDER — ONDANSETRON 4 MG PO TBDP: 4.0000 mg | ORAL_TABLET | Freq: Three times a day (TID) | ORAL | 0 refills | Status: AC | PRN

## 2019-10-28 MED ORDER — MEGESTROL ACETATE 40 MG PO TABS: 40.0000 mg | ORAL_TABLET | Freq: Two times a day (BID) | ORAL | 0 refills | Status: AC

## 2019-10-28 MED ORDER — DICLOFENAC SODIUM 50 MG PO TBEC: 50.0000 mg | DELAYED_RELEASE_TABLET | Freq: Two times a day (BID) | ORAL | 0 refills | Status: AC

## 2019-10-28 NOTE — Discharge Instructions (Signed)
Pregnancy test negative.  Start diclofenac as directed.  Zofran as needed for nausea/vomiting. Keep hydrated, urine should be clear to pale yellow in color.  Cytology ordered, you will be contacted with any positive results.  If symptoms not improving in the next few days, you can start Megace to control your cycle.  Please follow-up with OB/GYN for further evaluation and management needed.  If worsening symptoms, weakness, dizziness, nausea/vomiting not controlled by medicine, go to the emergency department for further evaluation.

## 2019-10-28 NOTE — ED Provider Notes (Addendum)
[facility]    CSN: [id number] Arrival date & time: 10/28/19  0827      History   Chief Complaint Chief Complaint  Patient presents with  . Vaginal Bleeding    HPI Maria Flores is a 24 y.o. female.   24 year old female comes in for abdominal cramping, nausea, vomiting, diarrhea after starting her menstrual cycle 5 days ago. She states missed last months cycle, which is abnormal for her, had negative pregnancy test she did at home. Her normal cycle usually lasts for 8 days, with 1 large pad/hr and abdominal cramping, nausea, diarrhea. For this cycle, she has been going through 2-3 large pads/hr. States has been taking ibuprofen, tylenol, midol for cramping without much relief. She has had nausea with 3 NBNB emesis/day. Denies fever, chills, body aches. Denies URI symptoms such as cough, congestion, sore throat. Denies shortness of breath, loss of taste/smell. Denies vaginal discharge, itching. Denies urinary frequency, dysuria, hematuria. Sexually active, 1 female partner, no condom use.      Past Medical History:  Diagnosis Date  . Anxiety   . Environmental allergies   . Environmental allergies   . Hypertension     Patient Active Problem List   Diagnosis Date Noted  . Chronic hypertension 02/08/2018    Past Surgical History:  Procedure Laterality Date  . NO PAST SURGERIES      OB History    Gravida  1   Para      Term      Preterm      AB      Living        SAB      TAB      Ectopic      Multiple      Live Births               Home Medications    Prior to Admission medications   Medication Sig Start Date End Date Taking? Authorizing Provider  albuterol (VENTOLIN HFA) 108 (90 Base) MCG/ACT inhaler Inhale 2 puffs into the lungs every 4 (four) hours as needed for wheezing or shortness of breath. 08/03/19   Hall-Potvin, [place], PA-C  diclofenac (VOLTAREN) 50 MG EC tablet Take 1 tablet (50 mg total) by mouth 2 (two) times  daily. 10/28/19   Cathie Hoops, [place]andalee Klang V, PA-C  fluticasone (FLONASE) 50 MCG/ACT nasal spray Place 1 spray into both nostrils daily. 08/03/19   Hall-Potvin, [place], PA-C  loratadine (CLARITIN) 10 MG tablet Take 1 tablet (10 mg total) by mouth daily. 08/03/19   Hall-Potvin, [place], PA-C  megestrol (MEGACE) 40 MG tablet Take 1 tablet (40 mg total) by mouth 2 (two) times daily. 10/28/19   Cathie Hoops, Vibhav Waddill V, PA-C  ondansetron (ZOFRAN ODT) 4 MG disintegrating tablet Take 1 tablet (4 mg total) by mouth every 8 (eight) hours as needed for nausea or vomiting. 10/28/19   Belinda Fisher, PA-C    Family History Family History  Problem Relation Age of Onset  . Cancer Neg Hx   . Diabetes Neg Hx   . Heart failure Neg Hx   . Hyperlipidemia Neg Hx   . Hypertension Neg Hx     Social History Social History   Tobacco Use  . Smoking status: Former Smoker    Quit date: 02/08/2018    Years since quitting: 1.7  . Smokeless tobacco: Never Used  Substance Use Topics  . Alcohol use: No  . Drug use: No     Allergies  Tomato and Apple   Review of Systems Review of Systems  Reason unable to perform ROS: See HPI as above.     Physical Exam Triage Vital Signs ED Triage Vitals  Enc Vitals Group     BP 10/28/19 0846 (!) 139/103     Pulse Rate 10/28/19 0846 79     Resp 10/28/19 0846 16     Temp 10/28/19 0846 98.5 F (36.9 C)     Temp Source 10/28/19 0846 Oral     SpO2 --      Weight --      Height --      Head Circumference --      Peak Flow --      Pain Score 10/28/19 0847 10     Pain Loc --      Pain Edu? --      Excl. in Gray? --    No data found.  Updated Vital Signs BP (!) 139/103 (BP Location: Left Arm)   Pulse 79   Temp 98.5 F (36.9 C) (Oral)   Resp 16   LMP 10/24/2019   Physical Exam Constitutional:      General: She is not in acute distress.    Appearance: She is well-developed. She is not ill-appearing, toxic-appearing or diaphoretic.  HENT:     Head: Normocephalic and atraumatic.  Eyes:       Conjunctiva/sclera: Conjunctivae normal.     Pupils: Pupils are equal, round, and reactive to light.  Cardiovascular:     Rate and Rhythm: Normal rate and regular rhythm.  Pulmonary:     Effort: Pulmonary effort is normal. No respiratory distress.     Comments: LCTAB Abdominal:     General: Bowel sounds are normal.     Palpations: Abdomen is soft.     Tenderness: There is no abdominal tenderness. There is no right CVA tenderness, left CVA tenderness, guarding or rebound.  Musculoskeletal:     Cervical back: Normal range of motion and neck supple.  Skin:    General: Skin is warm and dry.  Neurological:     Mental Status: She is alert and oriented to person, place, and time.  Psychiatric:        Behavior: Behavior normal.        Judgment: Judgment normal.      UC Treatments / Results  Labs (all labs ordered are listed, but only abnormal results are displayed) Labs Reviewed  POCT URINE PREGNANCY - Normal  CERVICOVAGINAL ANCILLARY ONLY    EKG   Radiology No results found.  Procedures Procedures (including critical care time)  Medications Ordered in UC Medications - No data to display  Initial Impression / Assessment and Plan / UC Course  I have reviewed the triage vital signs and the nursing notes.  Pertinent labs & imaging results that were available during my care of the patient were reviewed by me and considered in my medical decision making (see chart for details).    Patient without tachycardia, hypotension, able to ambulate on own without difficulty.  At this time will provide symptomatic treatment with diclofenac, Zofran.  Push fluids.  Cytology sent.  Given dysmenorrhea/menorrhagia, if bleeding does not improve in a few days, can start Megace to control bleeding.  Follow-up with OB/GYN for further evaluation.  Return precautions given.  Patient expresses understanding and agrees to plan.  Final Clinical Impressions(s) / UC Diagnoses   Final diagnoses:   Dysmenorrhea  Menorrhagia with regular cycle   ED Prescriptions  Medication Sig Dispense Auth. Provider   diclofenac (VOLTAREN) 50 MG EC tablet Take 1 tablet (50 mg total) by mouth 2 (two) times daily. 20 tablet Manaal Mandala V, PA-C   ondansetron (ZOFRAN ODT) 4 MG disintegrating tablet Take 1 tablet (4 mg total) by mouth every 8 (eight) hours as needed for nausea or vomiting. 20 tablet Kenna Kirn V, PA-C   megestrol (MEGACE) 40 MG tablet Take 1 tablet (40 mg total) by mouth 2 (two) times daily. 14 tablet Belinda Fisher, PA-C     PDMP not reviewed this encounter.   Belinda Fisher, PA-C 10/28/19 1255    Linward Headland V, PA-C 10/28/19 1255

## 2019-10-28 NOTE — ED Triage Notes (Signed)
Pt states started her menstrual cycle on Sunday and having severe abdominal cramping and pain to back, going through 2-3 pads an hour and having n/v/d. States vomiting x3 a day. States did not have a cycle last month and had a neg preg test. Denies any birth control

## 2019-11-03 LAB — CERVICOVAGINAL ANCILLARY ONLY
Bacterial vaginitis: POSITIVE — AB
Candida vaginitis: NEGATIVE
Chlamydia: NEGATIVE
Neisseria Gonorrhea: NEGATIVE
Trichomonas: NEGATIVE

## 2019-11-04 ENCOUNTER — Telehealth (HOSPITAL_COMMUNITY): Payer: Self-pay | Admitting: Emergency Medicine

## 2019-11-04 ENCOUNTER — Encounter (HOSPITAL_COMMUNITY): Payer: Self-pay

## 2019-11-04 MED ORDER — METRONIDAZOLE 500 MG PO TABS: 500.0000 mg | ORAL_TABLET | Freq: Two times a day (BID) | ORAL | 0 refills | Status: AC

## 2019-11-04 NOTE — Telephone Encounter (Signed)
Bacterial vaginosis is positive. Pt needs treatment. Flagyl 500 mg BID x 7 days #14 no refills sent to patients pharmacy of choice.    Attempted to reach patient. No answer at this time. Voicemail left.     

## 2019-11-20 IMAGING — US US OB TRANSVAGINAL
1 series · 15 of 28 positions shown · non-contrast
Comparison: None.

CLINICAL DATA: Vaginal bleeding in 1st trimester pregnancy. Pelvic
cramping.

EXAM:
OBSTETRIC <14 WK US AND TRANSVAGINAL OB US
TECHNIQUE: Both transabdominal and transvaginal ultrasound examinations were
performed for complete evaluation of the gestation as well as the
maternal uterus, adnexal regions, and pelvic cul-de-sac.
Transvaginal technique was performed to assess early pregnancy.

[Series 1: us ob transvaginal · 41 acquisitions, 15 frames shown]
[im 1/41]
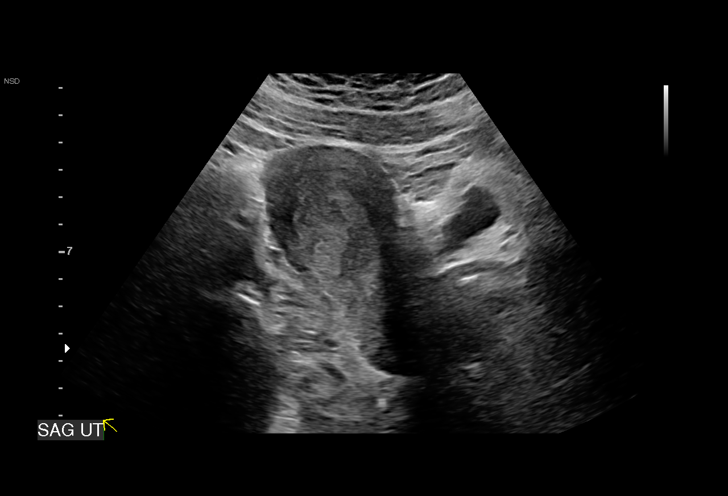
[im 3/41]
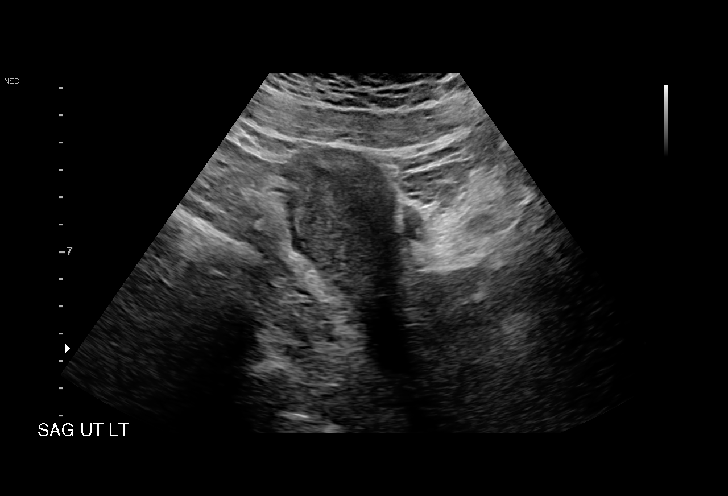
[im 6/41]
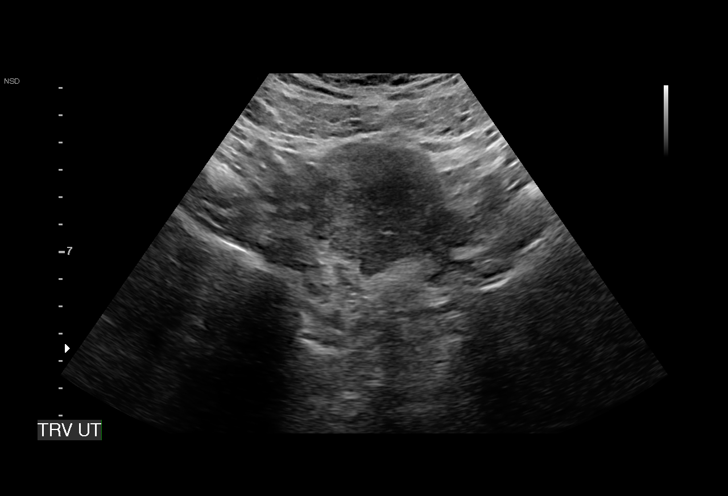
[im 9/41]
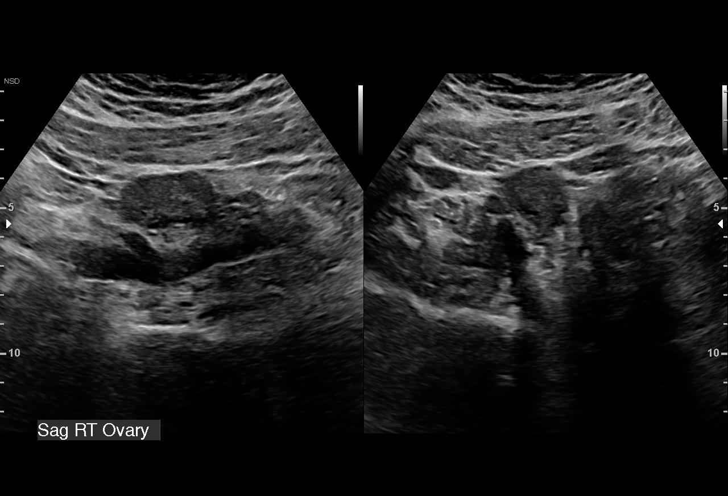
[im 12/41]
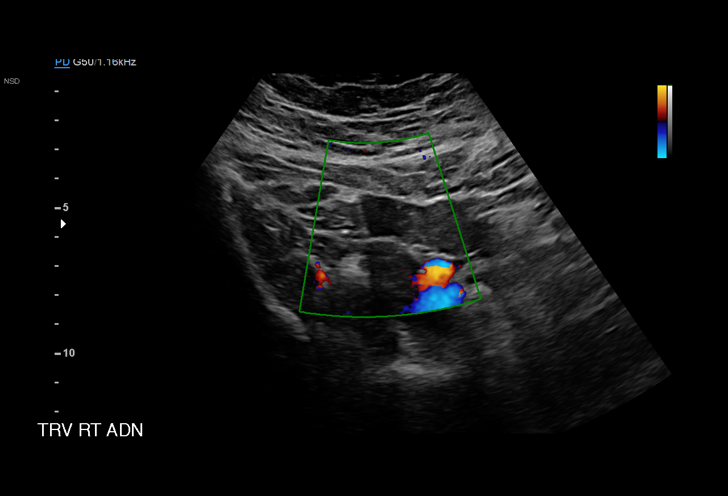
[im 15/41]
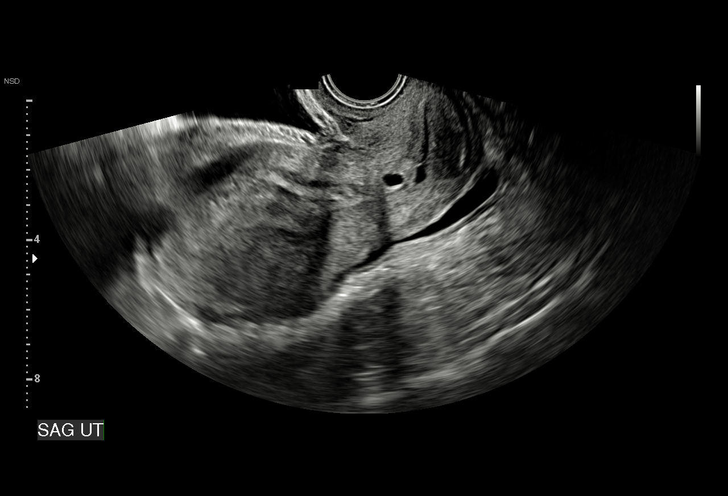
[im 18/41]
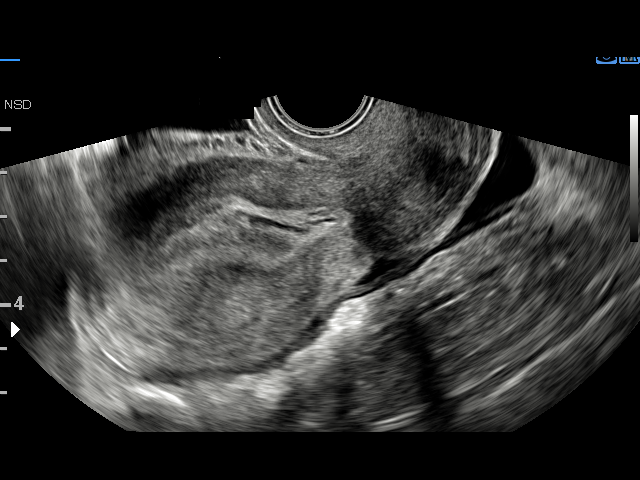
[im 21/41]
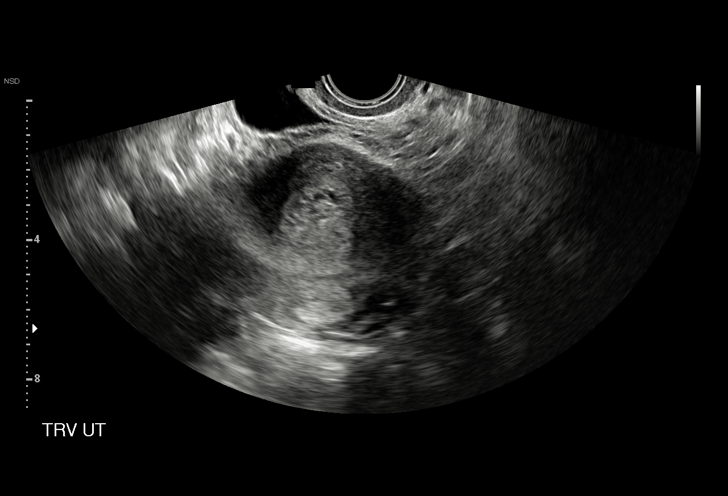
[im 23/41]
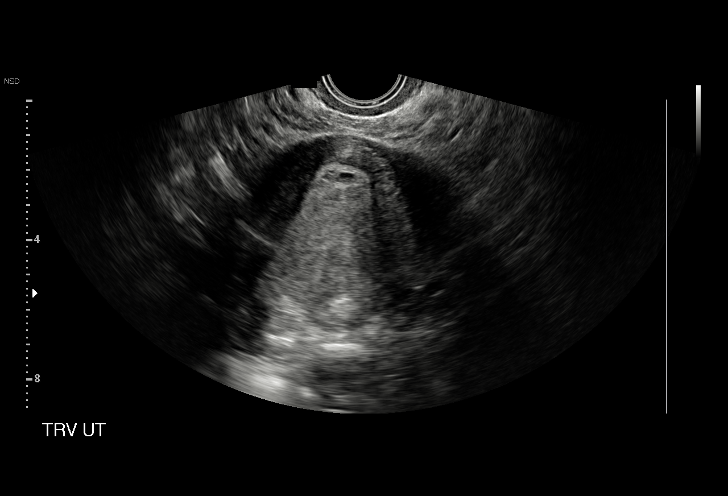
[im 26/41]
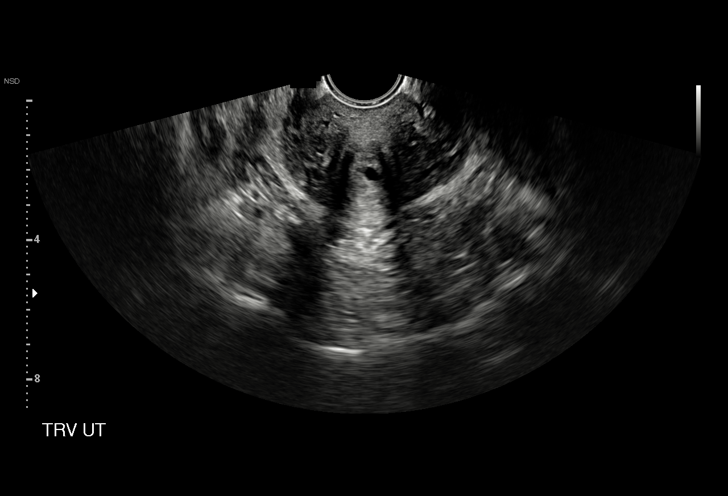
[im 29/41]
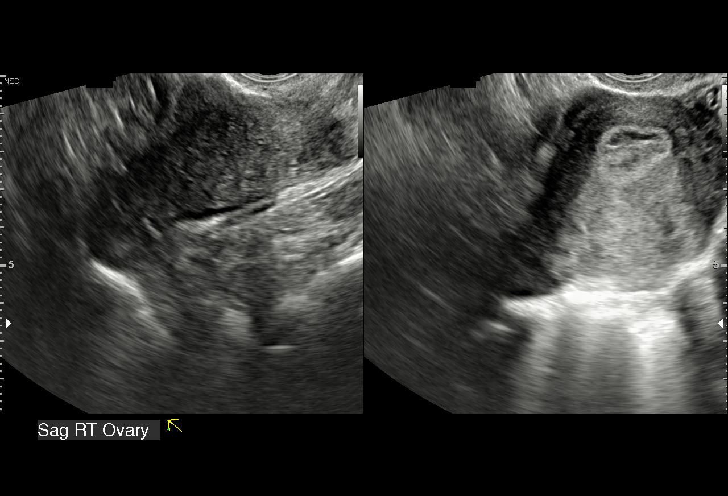
[im 32/41]
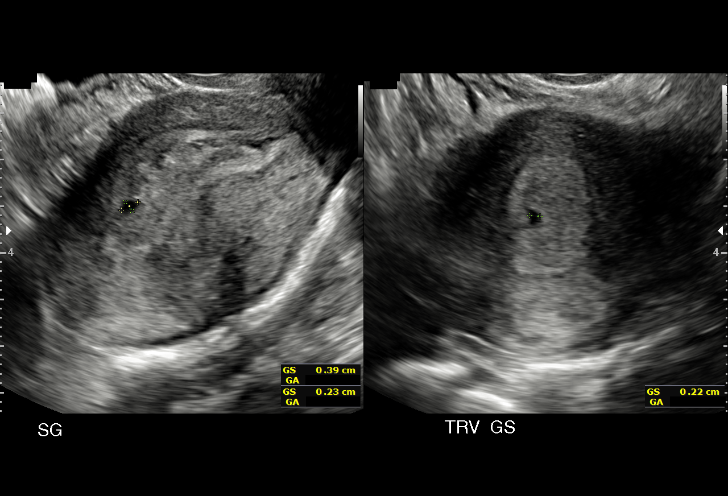
[im 35/41]
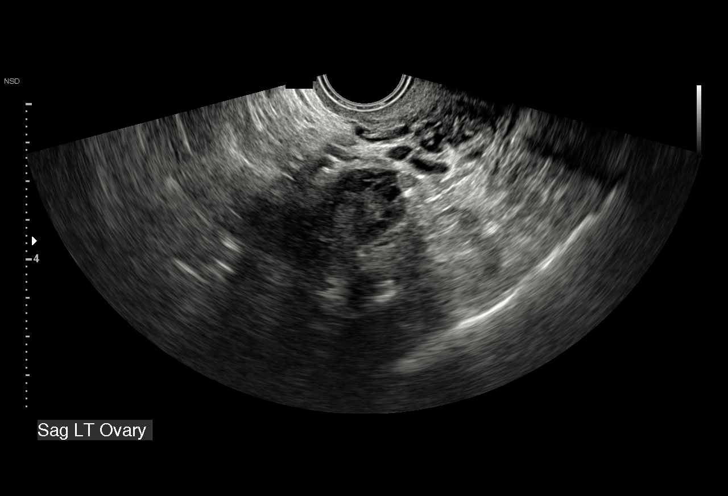
[im 38/41]
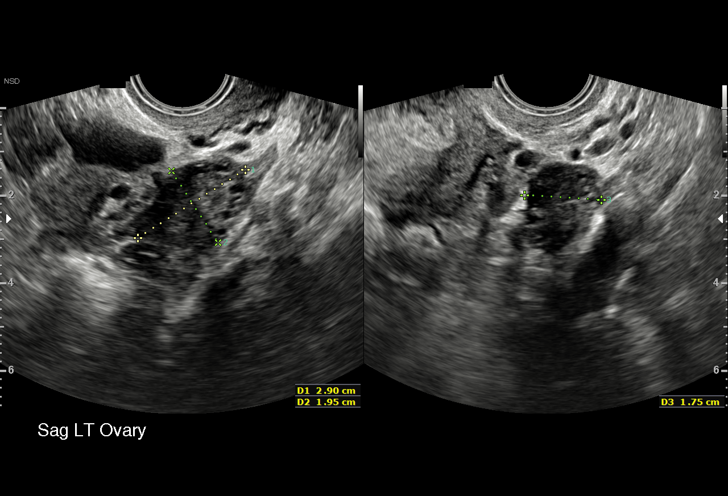
[im 41/41]
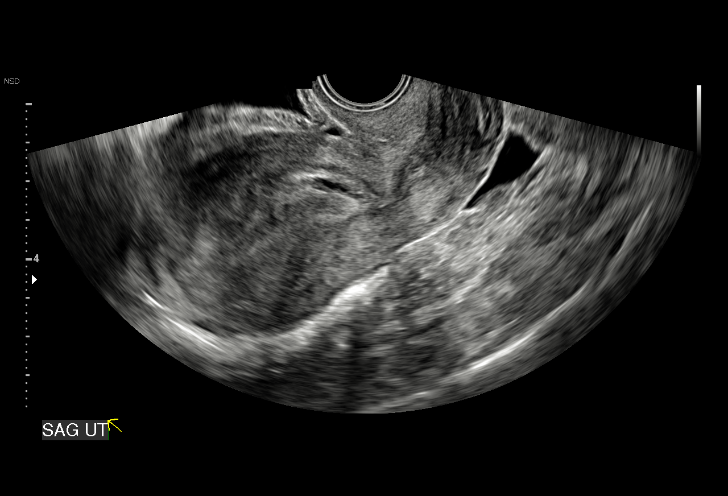

[15 of 28 positions shown; findings below may reference images not displayed]

FINDINGS: Intrauterine gestational sac: Possible early tiny intrauterine
gestational sac

Yolk sac:  Not Visualized.

Embryo:  Not Visualized.

MSD: 3 mm   4 w   6 d

Subchorionic hemorrhage:  None visualized.

Maternal uterus/adnexae: Normal appearance of both ovaries. No
adnexal mass identified. Tiny amount of simple free fluid in pelvic
cul-de-sac.
IMPRESSION: Suspect single early intrauterine gestational sac measuring
approximately 5 weeks. Consider following serial b-hCG levels, with
followup ultrasound to assess viability in 14 days.

No adnexal mass identified.

## 2020-06-05 ENCOUNTER — Ambulatory Visit
Admission: EM | Admit: 2020-06-05 | Discharge: 2020-06-05 | Disposition: A | Discharge status: Home / Self Care | Payer: Managed Care, Other (non HMO) | Attending: Emergency Medicine | Admitting: Emergency Medicine

## 2020-06-05 ENCOUNTER — Encounter: Payer: Self-pay | Admitting: Emergency Medicine

## 2020-06-05 ENCOUNTER — Other Ambulatory Visit: Payer: Self-pay

## 2020-06-05 DIAGNOSIS — J069 Acute upper respiratory infection, unspecified: Secondary | ICD-10-CM | POA: Diagnosis not present

## 2020-06-05 DIAGNOSIS — Z1152 Encounter for screening for COVID-19: Secondary | ICD-10-CM | POA: Diagnosis not present

## 2020-06-05 MED ORDER — BENZONATATE 100 MG PO CAPS: 100.0000 mg | ORAL_CAPSULE | Freq: Three times a day (TID) | ORAL | 0 refills | Status: AC

## 2020-06-05 MED ORDER — PREDNISONE 50 MG PO TABS: 50.0000 mg | ORAL_TABLET | Freq: Every day | ORAL | 0 refills | Status: AC

## 2020-06-05 MED ORDER — ALBUTEROL SULFATE HFA 108 (90 BASE) MCG/ACT IN AERS: 2.0000 | INHALATION_SPRAY | RESPIRATORY_TRACT | 0 refills | Status: AC | PRN

## 2020-06-05 MED ORDER — CETIRIZINE HCL 10 MG PO TABS: 10.0000 mg | ORAL_TABLET | Freq: Every day | ORAL | 0 refills | Status: AC

## 2020-06-05 MED ORDER — AEROCHAMBER PLUS FLO-VU MEDIUM MISC: 1.0000 | Freq: Once | 0 refills | Status: AC

## 2020-06-05 MED ORDER — FLUTICASONE PROPIONATE 50 MCG/ACT NA SUSP: 1.0000 | Freq: Every day | NASAL | 0 refills | Status: AC

## 2020-06-05 NOTE — ED Triage Notes (Signed)
Pt here for cold and URI sx with ear ache x 4days; pt sts cough and asthma sx as well

## 2020-06-05 NOTE — Discharge Instructions (Addendum)
Your COVID test is pending - it is important to quarantine / isolate at home until your results are back. °If you test positive and would like further evaluation for persistent or worsening symptoms, you may schedule an E-visit or virtual (video) visit throughout the [facility] MyChart app or website. ° °PLEASE NOTE: If you develop severe chest pain or shortness of breath please go to the ER or call [phone] for further evaluation --> DO NOT schedule electronic or virtual visits for this. °Please call our office for further guidance / recommendations as needed. ° °For information about the Covid vaccine, please visit [email or web address] °

## 2020-06-05 NOTE — ED Provider Notes (Signed)
[facility]    CSN: [id number] Arrival date & time: 06/05/20  0825      History   Chief Complaint Chief Complaint  Patient presents with  . URI  . Asthma    HPI Maria Flores is a 24 y.o. female presenting for URI symptoms x4 days.  Patient writes history: Endorsing dry cough, nasal congestion, earaches.  Patient does note history of asthma: Has been using albuterol inhaler without relief.  States that she felt more short of breath last night, though does admit to taking albuterol more than prescribed.  Has also tried Tylenol, Flonase without significant relief.  No known sick contacts.  Did not get Covid vaccine.  Denies fever, chest pain, shortness of breath, lower leg swelling, abdominal pain, vomiting, diarrhea.   Past Medical History:  Diagnosis Date  . Anxiety   . Environmental allergies   . Environmental allergies   . Hypertension     Patient Active Problem List   Diagnosis Date Noted  . Chronic hypertension 02/08/2018    Past Surgical History:  Procedure Laterality Date  . NO PAST SURGERIES      OB History    Gravida  1   Para      Term      Preterm      AB      Living        SAB      TAB      Ectopic      Multiple      Live Births               Home Medications    Prior to Admission medications   Medication Sig Start Date End Date Taking? Authorizing Provider  albuterol (VENTOLIN HFA) 108 (90 Base) MCG/ACT inhaler Inhale 2 puffs into the lungs every 4 (four) hours as needed for wheezing or shortness of breath. 06/05/20   Hall-Potvin, [place], PA-C  benzonatate (TESSALON) 100 MG capsule Take 1 capsule (100 mg total) by mouth every 8 (eight) hours. 06/05/20   Hall-Potvin, [place], PA-C  cetirizine (ZYRTEC ALLERGY) 10 MG tablet Take 1 tablet (10 mg total) by mouth daily. 06/05/20   Hall-Potvin, [place], PA-C  diclofenac (VOLTAREN) 50 MG EC tablet Take 1 tablet (50 mg total) by mouth 2 (two) times daily. 10/28/19   Cathie Hoops,  Amy V, PA-C  fluticasone (FLONASE) 50 MCG/ACT nasal spray Place 1 spray into both nostrils daily. 06/05/20   Hall-Potvin, [place], PA-C  megestrol (MEGACE) 40 MG tablet Take 1 tablet (40 mg total) by mouth 2 (two) times daily. 10/28/19   Cathie Hoops, Amy V, PA-C  ondansetron (ZOFRAN ODT) 4 MG disintegrating tablet Take 1 tablet (4 mg total) by mouth every 8 (eight) hours as needed for nausea or vomiting. 10/28/19   Belinda Fisher, PA-C  predniSONE (DELTASONE) 50 MG tablet Take 1 tablet (50 mg total) by mouth daily with breakfast. 06/05/20   Hall-Potvin, [place], PA-C  Spacer/Aero-Holding Chambers (AEROCHAMBER PLUS FLO-VU MEDIUM) MISC 1 each by Other route once for 1 dose. 06/05/20 06/05/20  Hall-Potvin, [place], PA-C  loratadine (CLARITIN) 10 MG tablet Take 1 tablet (10 mg total) by mouth daily. 08/03/19 06/05/20  Hall-Potvin, [place], PA-C    Family History Family History  Problem Relation Age of Onset  . Cancer Neg Hx   . Diabetes Neg Hx   . Heart failure Neg Hx   . Hyperlipidemia Neg Hx   . Hypertension Neg Hx     Social History Social  History   Tobacco Use  . Smoking status: Former Smoker    Quit date: 02/08/2018    Years since quitting: 2.3  . Smokeless tobacco: Never Used  Substance Use Topics  . Alcohol use: No  . Drug use: No     Allergies   Tomato and Apple   Review of Systems As per HPI  Physical Exam Triage Vital Signs ED Triage Vitals  Enc Vitals Group     BP 06/05/20 0905 (!) 163/97     Pulse Rate 06/05/20 0905 84     Resp 06/05/20 0905 18     Temp 06/05/20 0905 98.8 F (37.1 C)     Temp Source 06/05/20 0905 Oral     SpO2 06/05/20 0905 96 %     Weight --      Height --      Head Circumference --      Peak Flow --      Pain Score 06/05/20 0906 5     Pain Loc --      Pain Edu? --      Excl. in GC? --    No data found.  Updated Vital Signs BP (!) 163/97 (BP Location: Left Arm)   Pulse 84   Temp 98.8 F (37.1 C) (Oral)   Resp 18   SpO2 96%   Visual  Acuity Right Eye Distance:   Left Eye Distance:   Bilateral Distance:    Right Eye Near:   Left Eye Near:    Bilateral Near:     Physical Exam Constitutional:      General: She is not in acute distress. HENT:     Head: Normocephalic and atraumatic.  Eyes:     General: No scleral icterus.    Pupils: Pupils are equal, round, and reactive to light.  Cardiovascular:     Rate and Rhythm: Normal rate.  Pulmonary:     Effort: Pulmonary effort is normal. No respiratory distress.     Breath sounds: No stridor. No wheezing, rhonchi or rales.     Comments: Decreased BS bilaterally Chest:     Chest wall: No tenderness.  Skin:    Coloration: Skin is not jaundiced or pale.  Neurological:     Mental Status: She is alert and oriented to person, place, and time.      UC Treatments / Results  Labs (all labs ordered are listed, but only abnormal results are displayed) Labs Reviewed  NOVEL CORONAVIRUS, NAA    EKG   Radiology No results found.  Procedures Procedures (including critical care time)  Medications Ordered in UC Medications - No data to display  Initial Impression / Assessment and Plan / UC Course  I have reviewed the triage vital signs and the nursing notes.  Pertinent labs & imaging results that were available during my care of the patient were reviewed by me and considered in my medical decision making (see chart for details).     Patient afebrile, nontoxic, with SpO2 96%.  Covid PCR pending.  Patient to quarantine until results are back.  We will treat supportively as outlined below.  Return precautions discussed, patient verbalized understanding and is agreeable to plan. Final Clinical Impressions(s) / UC Diagnoses   Final diagnoses:  Encounter for screening for COVID-19  URI with cough and congestion     Discharge Instructions     Your COVID test is pending - it is important to quarantine / isolate at home until your results are back.  If you test  positive and would like further evaluation for persistent or worsening symptoms, you may schedule an E-visit or virtual (video) visit throughout the [facility] app or website.  PLEASE NOTE: If you develop severe chest pain or shortness of breath please go to the ER or call [phone] for further evaluation --> DO NOT schedule electronic or virtual visits for this. Please call our office for further guidance / recommendations as needed.  For information about the Covid vaccine, please visit [email or web address]    ED Prescriptions    Medication Sig Dispense Auth. Provider   albuterol (VENTOLIN HFA) 108 (90 Base) MCG/ACT inhaler Inhale 2 puffs into the lungs every 4 (four) hours as needed for wheezing or shortness of breath. 18 g Hall-Potvin, [place], PA-C   Spacer/Aero-Holding Chambers (AEROCHAMBER PLUS FLO-VU MEDIUM) MISC 1 each by Other route once for 1 dose. 1 each Hall-Potvin, [place], PA-C   predniSONE (DELTASONE) 50 MG tablet Take 1 tablet (50 mg total) by mouth daily with breakfast. 5 tablet Hall-Potvin, [place], PA-C   cetirizine (ZYRTEC ALLERGY) 10 MG tablet Take 1 tablet (10 mg total) by mouth daily. 30 tablet Hall-Potvin, [place], PA-C   fluticasone (FLONASE) 50 MCG/ACT nasal spray Place 1 spray into both nostrils daily. 16 g Hall-Potvin, [place], PA-C   benzonatate (TESSALON) 100 MG capsule Take 1 capsule (100 mg total) by mouth every 8 (eight) hours. 21 capsule Hall-Potvin, [place], PA-C     PDMP not reviewed this encounter.   Hall-Potvin, [place], [place] 06/05/20 1023

## 2020-06-06 LAB — NOVEL CORONAVIRUS, NAA: SARS-CoV-2, NAA: NOT DETECTED

## 2020-06-06 LAB — SARS-COV-2, NAA 2 DAY TAT
